# Patient Record
Sex: Male | Born: 1940 | Hispanic: Yes | Marital: Married | Smoking: Never smoker
Health system: Southern US, Community
[De-identification: ages and names within clinical notes are randomized; demographics above are authoritative.]

## PROBLEM LIST (undated history)

## (undated) DIAGNOSIS — E119 Type 2 diabetes mellitus without complications: Secondary | ICD-10-CM

---

## 2008-01-06 ENCOUNTER — Inpatient Hospital Stay (HOSPITAL_COMMUNITY): Admission: EM | Admit: 2008-01-06 | Discharge: 2008-01-17 | Payer: Self-pay | Admitting: Emergency Medicine

## 2008-01-24 ENCOUNTER — Ambulatory Visit: Payer: Self-pay | Admitting: Family Medicine

## 2009-06-10 ENCOUNTER — Inpatient Hospital Stay (HOSPITAL_COMMUNITY)
Admission: EM | Admit: 2009-06-10 | Discharge: 2009-06-14 | Payer: Self-pay | Source: Home / Self Care | Admitting: Emergency Medicine

## 2009-06-11 ENCOUNTER — Ambulatory Visit: Payer: Self-pay | Admitting: Internal Medicine

## 2009-06-27 ENCOUNTER — Encounter (INDEPENDENT_AMBULATORY_CARE_PROVIDER_SITE_OTHER): Payer: Self-pay | Admitting: Adult Health

## 2009-06-27 ENCOUNTER — Ambulatory Visit (HOSPITAL_COMMUNITY): Admission: RE | Admit: 2009-06-27 | Discharge: 2009-06-27 | Payer: Self-pay | Admitting: Internal Medicine

## 2009-06-27 ENCOUNTER — Ambulatory Visit: Payer: Self-pay | Admitting: Family Medicine

## 2009-06-27 LAB — CONVERTED CEMR LAB
ALT: 58 units/L — ABNORMAL HIGH (ref 0–53)
AST: 33 units/L (ref 0–37)
Albumin: 4.4 g/dL (ref 3.5–5.2)
Basophils Relative: 1 % (ref 0–1)
Eosinophils Absolute: 0.1 10*3/uL (ref 0.0–0.7)
Eosinophils Relative: 2 % (ref 0–5)
Free T4: 0.86 ng/dL (ref 0.80–1.80)
HCT: 42.4 % (ref 39.0–52.0)
Indirect Bilirubin: 0.5 mg/dL (ref 0.0–0.9)
Lymphs Abs: 2.7 10*3/uL (ref 0.7–4.0)
MCHC: 34.4 g/dL (ref 30.0–36.0)
Microalb, Ur: 2.14 mg/dL — ABNORMAL HIGH (ref 0.00–1.89)
Monocytes Relative: 7 % (ref 3–12)
Neutro Abs: 4.9 10*3/uL (ref 1.7–7.7)
Platelets: 303 10*3/uL (ref 150–400)
RDW: 12.9 % (ref 11.5–15.5)
TSH: 3.594 microintl units/mL (ref 0.350–4.500)
Total Protein: 8 g/dL (ref 6.0–8.3)
Vit D, 25-Hydroxy: 17 ng/mL — ABNORMAL LOW (ref 30–89)

## 2009-06-28 ENCOUNTER — Emergency Department (HOSPITAL_COMMUNITY): Admission: EM | Admit: 2009-06-28 | Discharge: 2009-06-28 | Payer: Self-pay | Admitting: Emergency Medicine

## 2009-06-29 ENCOUNTER — Ambulatory Visit: Payer: Self-pay | Admitting: Vascular Surgery

## 2009-06-29 ENCOUNTER — Ambulatory Visit (HOSPITAL_COMMUNITY): Admission: RE | Admit: 2009-06-29 | Discharge: 2009-06-29 | Payer: Self-pay | Admitting: Emergency Medicine

## 2009-07-03 ENCOUNTER — Ambulatory Visit: Payer: Self-pay | Admitting: Internal Medicine

## 2009-07-25 ENCOUNTER — Ambulatory Visit: Payer: Self-pay | Admitting: Internal Medicine

## 2009-07-29 ENCOUNTER — Ambulatory Visit: Payer: Self-pay | Admitting: Internal Medicine

## 2009-11-14 IMAGING — CT CT ABDOMEN W/ CM
3 of 7 series · 13 of 46 positions shown, 19 images · IV contrast (agent unspecified)
Comparison: None

CT ABDOMEN

CLINICAL DATA: Fever, vomiting, abdominal pain

CT ABDOMEN AND PELVIS WITH CONTRAST
TECHNIQUE: Multidetector CT imaging of the abdomen and pelvis was
performed using the standard protocol following bolus
administration of intravenous contrast.
Contrast: 100 ml Zmnipaque-HPP

[Series 2: routine abdomen · axial · 0.88mm/px · z∈[-448,+77]mm · 9 of 133 slices shown, 15 images]
[im 14/133  soft-tissue]
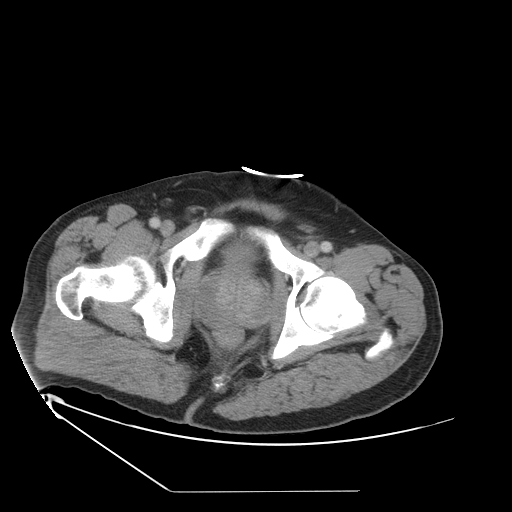
[im 14/133  bone]
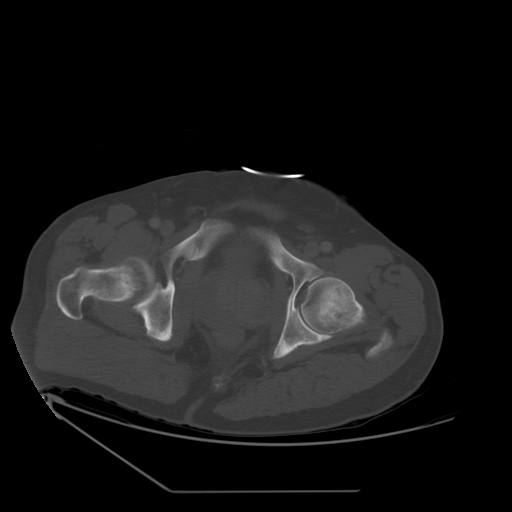
[im 27/133  soft-tissue]
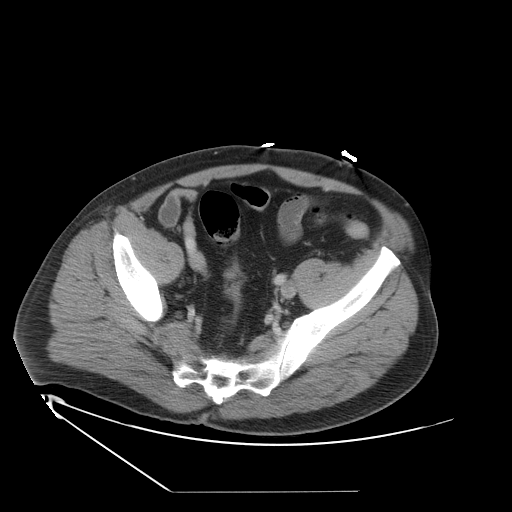
[im 40/133  soft-tissue]
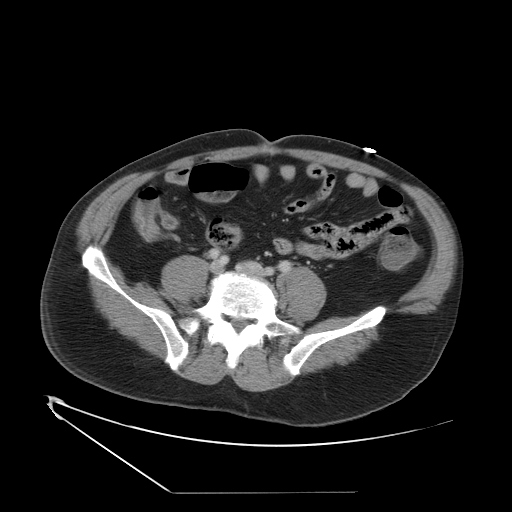
[im 53/133  soft-tissue]
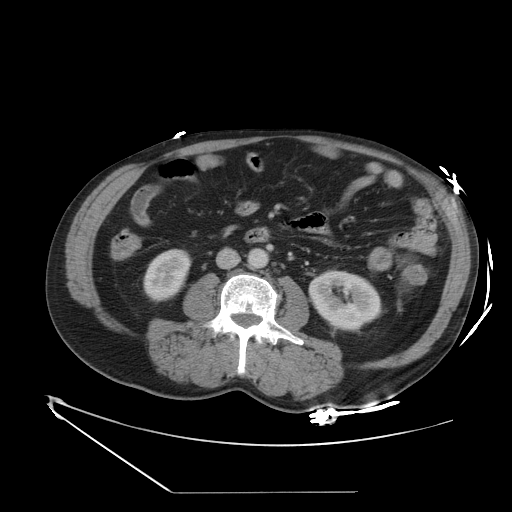
[im 67/133  soft-tissue]
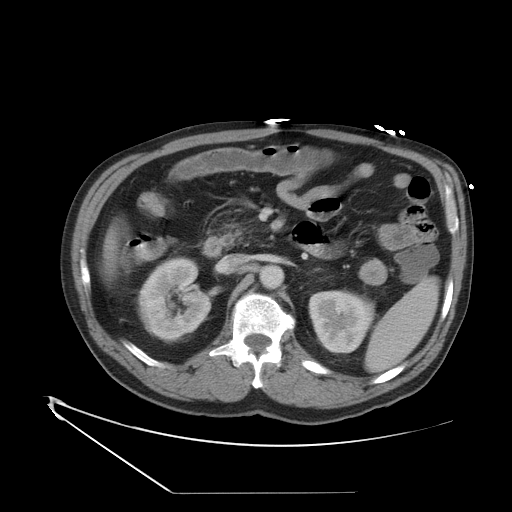
[im 80/133  soft-tissue]
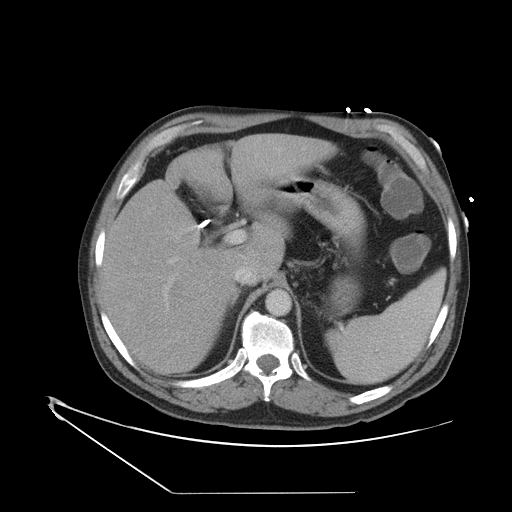
[im 80/133  lung]
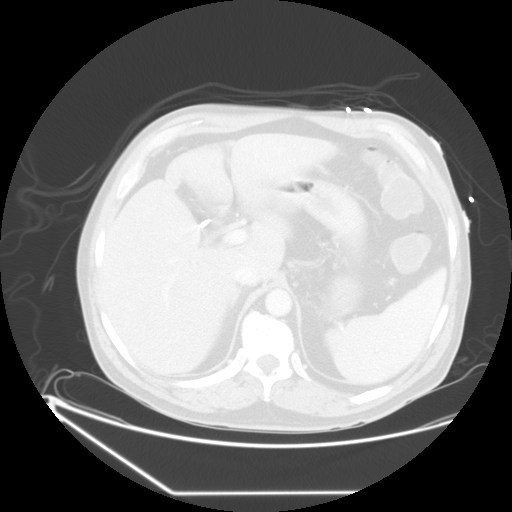
[im 93/133  soft-tissue]
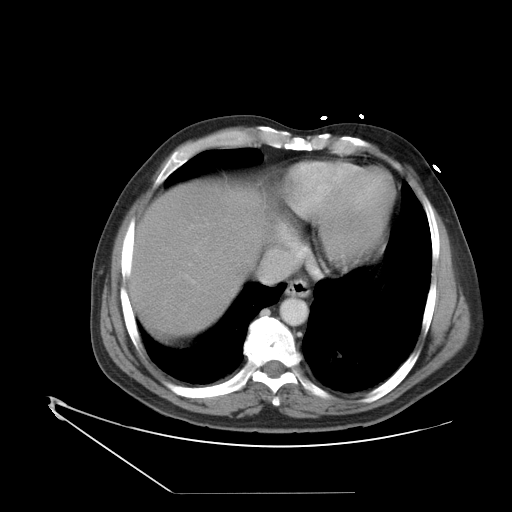
[im 93/133  lung]
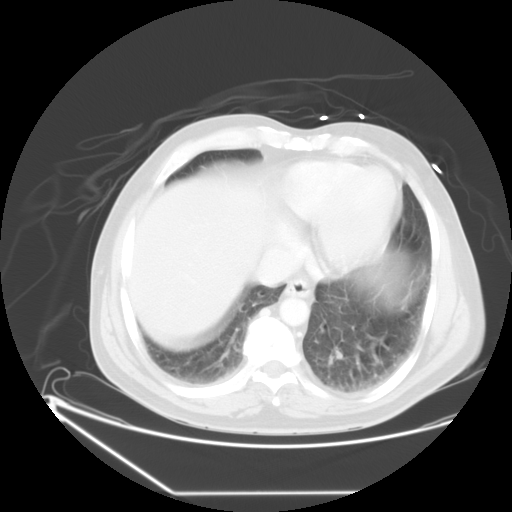
[im 106/133  soft-tissue]
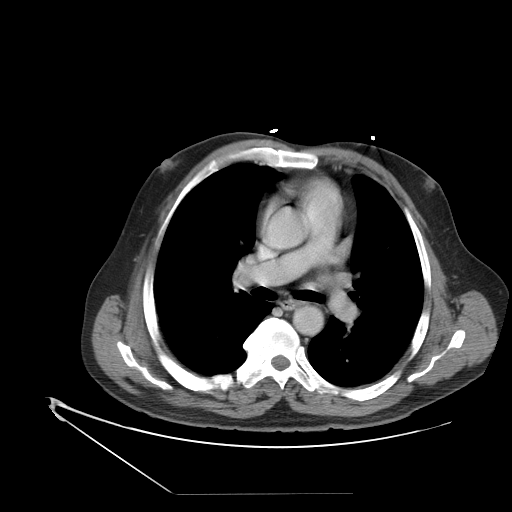
[im 106/133  lung]
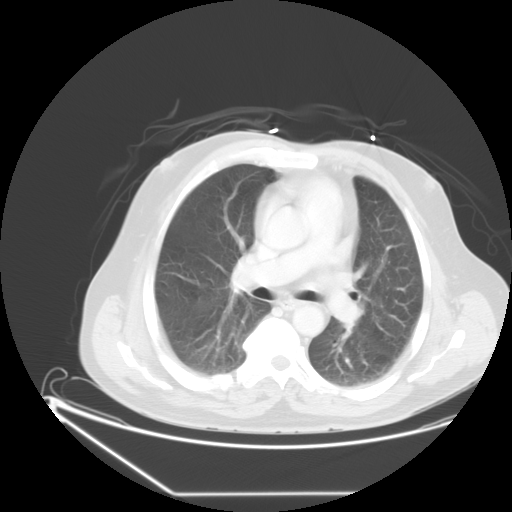
[im 119/133  soft-tissue]
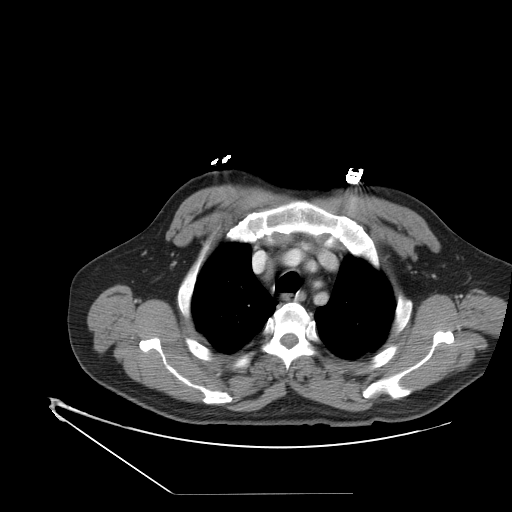
[im 119/133  lung]
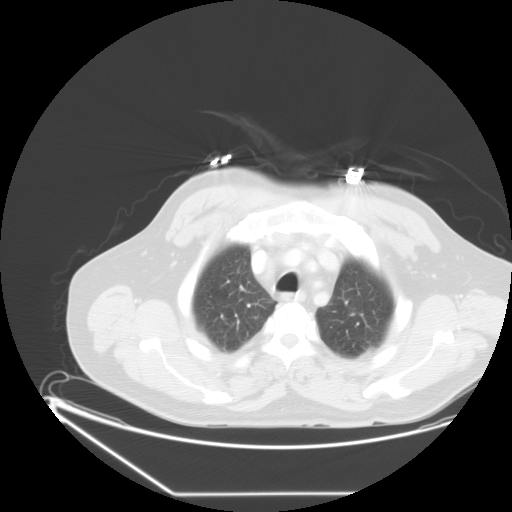
[im 119/133  bone]
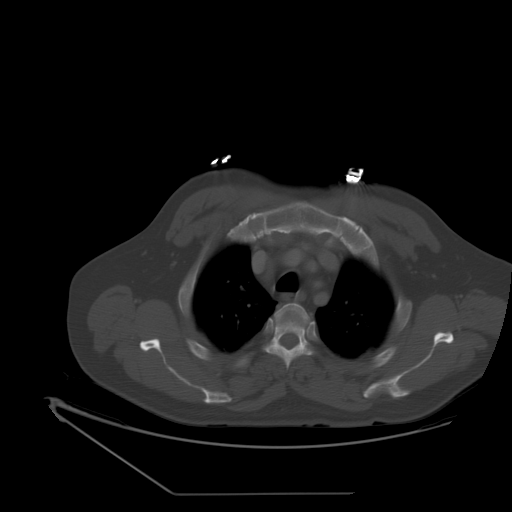

[Series 400: reformatted · sagittal · 0.90mm/px · 1 of 115 slices shown (1 of 2)]
[im 39/115  soft-tissue]
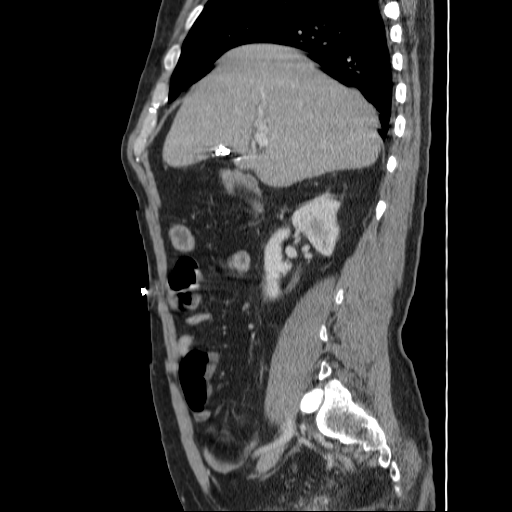

[Series 401: reformatted · coronal · 0.90mm/px · 3 of 110 slices shown (2 of 2)]
[im 37/110  soft-tissue]
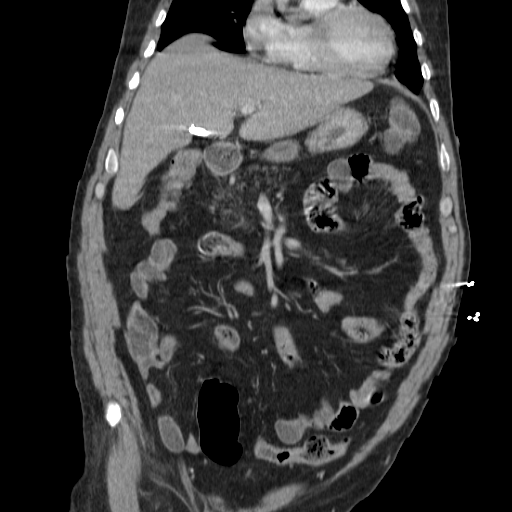
[im 49/110  soft-tissue]
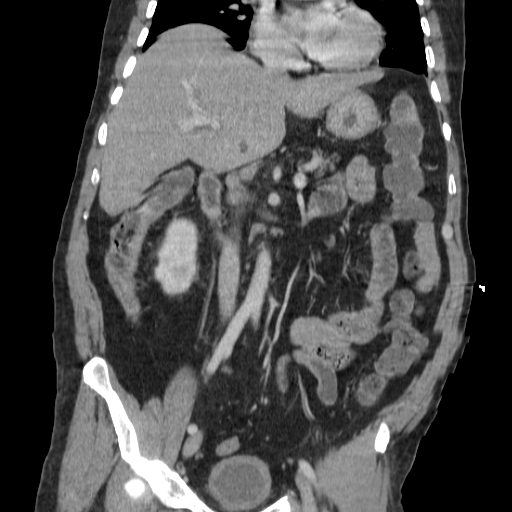
[im 61/110  soft-tissue]
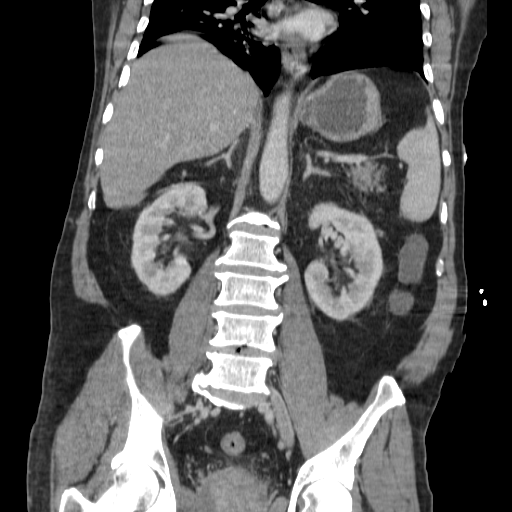

[13 of 46 positions shown; findings below may reference images not displayed]

FINDINGS: The lung bases are clear.  The liver enhances with no
focal abnormality and no ductal dilatation is seen.  Surgical clips
are present from prior cholecystectomy.  The pancreas appears
relatively fatty infiltrated particular the head of the pancreas.
No pancreatic ductal dilatation is seen.  The adrenal glands and
spleen appear normal.  The kidneys enhance and on delayed images
the pelvocaliceal systems appear normal.  The abdominal aorta is
normal in caliber.
IMPRESSION: No acute abnormality on CT of the abdomen.  Fatty infiltration of
the pancreas.

CT PELVIS
FINDINGS: The prostate is moderately enlarged.  The urinary
bladder is somewhat thick-walled although not distended, suggestive
of a degree of bladder outlet obstruction.  The appendix is
moderately well seen in the right lower quadrant and appears
normal.  No fluid is seen within the pelvis.  No bowel distention
is noted.
IMPRESSION: 1.  No acute abnormality.
2.  Moderately enlarged prostate with somewhat thick-walled urinary
bladder suggesting a degree of bladder outlet obstruction.
3.  The appendix appears normal.

## 2009-11-18 IMAGING — CR DG ABDOMEN 1V
1 series · 1 of 1 positions shown · non-contrast
Comparison: 12/29/2007

CLINICAL DATA: Evaluate for ileus.  Fever and vomiting.

ABDOMEN - 1 VIEW

[t abdomen supine]
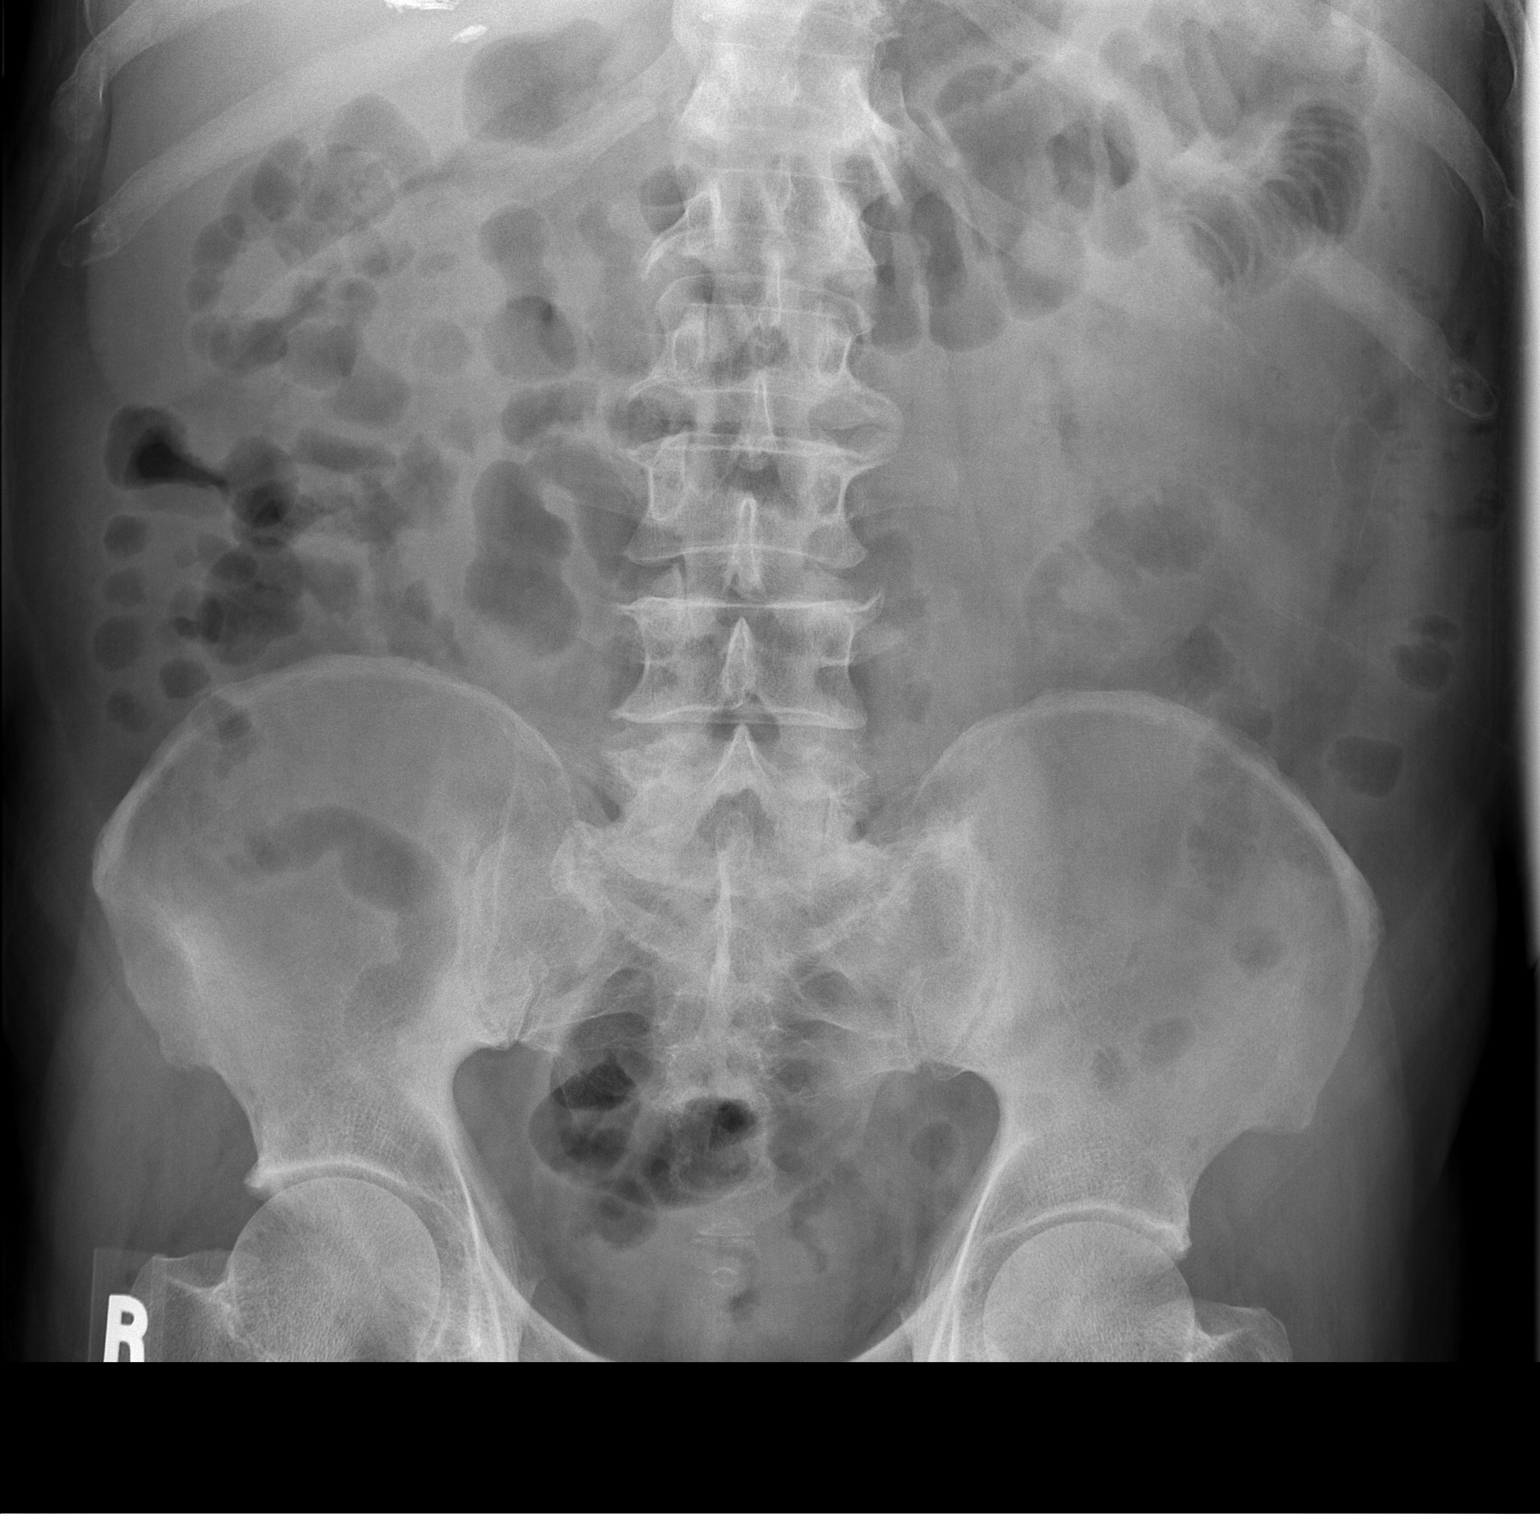

[1 of 1 positions shown; findings below may reference images not displayed]

FINDINGS: Single abdominal view was obtained.  There is gas in both
small and large bowel.  There is a mildly dilated loop of small
bowel in the left upper quadrant measuring up to 3.4 cm.  Evidence
for cholecystectomy clips in the right upper quadrant.
Degenerative endplate changes in the lumbar spine.
IMPRESSION: The abdominal bowel gas pattern is nonspecific although findings
could be related to an ileus pattern.

## 2010-04-27 LAB — POCT I-STAT, CHEM 8
BUN: 22 mg/dL (ref 6–23)
Chloride: 102 mEq/L (ref 96–112)
Glucose, Bld: 241 mg/dL — ABNORMAL HIGH (ref 70–99)
Potassium: 3.6 mEq/L (ref 3.5–5.1)

## 2010-04-28 LAB — COMPREHENSIVE METABOLIC PANEL
ALT: 363 U/L — ABNORMAL HIGH (ref 0–53)
AST: 142 U/L — ABNORMAL HIGH (ref 0–37)
AST: 251 U/L — ABNORMAL HIGH (ref 0–37)
Albumin: 3.3 g/dL — ABNORMAL LOW (ref 3.5–5.2)
Alkaline Phosphatase: 213 U/L — ABNORMAL HIGH (ref 39–117)
Alkaline Phosphatase: 231 U/L — ABNORMAL HIGH (ref 39–117)
CO2: 26 mEq/L (ref 19–32)
CO2: 28 mEq/L (ref 19–32)
Chloride: 100 mEq/L (ref 96–112)
Creatinine, Ser: 0.84 mg/dL (ref 0.4–1.5)
GFR calc Af Amer: >60 mL/min (ref >60)
GFR calc Af Amer: >60 mL/min (ref >60)
GFR calc non Af Amer: >60 mL/min (ref >60)
Glucose, Bld: 175 mg/dL — ABNORMAL HIGH (ref 70–99)
Potassium: 3.2 mEq/L — ABNORMAL LOW (ref 3.5–5.1)
Potassium: 3.8 mEq/L (ref 3.5–5.1)
Sodium: 137 mEq/L (ref 135–145)
Total Bilirubin: 3.8 mg/dL — ABNORMAL HIGH (ref 0.3–1.2)
Total Bilirubin: 6 mg/dL — ABNORMAL HIGH (ref 0.3–1.2)

## 2010-04-28 LAB — BASIC METABOLIC PANEL
BUN: 8 mg/dL (ref 6–23)
Creatinine, Ser: 0.62 mg/dL (ref 0.4–1.5)
GFR calc Af Amer: >60 mL/min (ref >60)
Potassium: 3.5 mEq/L (ref 3.5–5.1)
Sodium: 137 mEq/L (ref 135–145)

## 2010-04-28 LAB — CULTURE, BLOOD (ROUTINE X 2)
Culture: NO GROWTH
Culture: NO GROWTH

## 2010-04-28 LAB — GLUCOSE, CAPILLARY
Glucose-Capillary: 117 mg/dL — ABNORMAL HIGH (ref 70–99)
Glucose-Capillary: 131 mg/dL — ABNORMAL HIGH (ref 70–99)
Glucose-Capillary: 139 mg/dL — ABNORMAL HIGH (ref 70–99)
Glucose-Capillary: 146 mg/dL — ABNORMAL HIGH (ref 70–99)
Glucose-Capillary: 150 mg/dL — ABNORMAL HIGH (ref 70–99)
Glucose-Capillary: 175 mg/dL — ABNORMAL HIGH (ref 70–99)
Glucose-Capillary: 176 mg/dL — ABNORMAL HIGH (ref 70–99)
Glucose-Capillary: 193 mg/dL — ABNORMAL HIGH (ref 70–99)
Glucose-Capillary: 208 mg/dL — ABNORMAL HIGH (ref 70–99)

## 2010-04-28 LAB — COMPREHENSIVE METABOLIC PANEL WITH GFR
ALT: 206 U/L — ABNORMAL HIGH (ref 0–53)
AST: 66 U/L — ABNORMAL HIGH (ref 0–37)
Albumin: 3.5 g/dL (ref 3.5–5.2)
Alkaline Phosphatase: 217 U/L — ABNORMAL HIGH (ref 39–117)
BUN: 11 mg/dL (ref 6–23)
CO2: 31 meq/L (ref 19–32)
Calcium: 9 mg/dL (ref 8.4–10.5)
Chloride: 101 meq/L (ref 96–112)
Creatinine, Ser: 0.92 mg/dL (ref 0.4–1.5)
GFR calc Af Amer: >60 mL/min (ref >60)
GFR calc non Af Amer: >60 mL/min (ref >60)
Glucose, Bld: 133 mg/dL — ABNORMAL HIGH (ref 70–99)
Potassium: 4.1 meq/L (ref 3.5–5.1)
Sodium: 138 meq/L (ref 135–145)
Total Bilirubin: 2.4 mg/dL — ABNORMAL HIGH (ref 0.3–1.2)
Total Protein: 7.2 g/dL (ref 6.0–8.3)

## 2010-04-28 LAB — RAPID URINE DRUG SCREEN, HOSP PERFORMED
Amphetamines: NOT DETECTED
Barbiturates: NOT DETECTED
Benzodiazepines: NOT DETECTED
Cocaine: NOT DETECTED
Opiates: POSITIVE — AB
Tetrahydrocannabinol: NOT DETECTED

## 2010-04-28 LAB — HEPATIC FUNCTION PANEL
ALT: 383 U/L — ABNORMAL HIGH (ref 0–53)
Alkaline Phosphatase: 194 U/L — ABNORMAL HIGH (ref 39–117)
Alkaline Phosphatase: 227 U/L — ABNORMAL HIGH (ref 39–117)
Bilirubin, Direct: 3 mg/dL — ABNORMAL HIGH (ref 0.0–0.3)
Indirect Bilirubin: 1.1 mg/dL — ABNORMAL HIGH (ref 0.3–0.9)
Indirect Bilirubin: 2.1 mg/dL — ABNORMAL HIGH (ref 0.3–0.9)
Total Bilirubin: 1.5 mg/dL — ABNORMAL HIGH (ref 0.3–1.2)
Total Bilirubin: 5.1 mg/dL — ABNORMAL HIGH (ref 0.3–1.2)
Total Protein: 6.8 g/dL (ref 6.0–8.3)
Total Protein: 7.4 g/dL (ref 6.0–8.3)

## 2010-04-28 LAB — APTT: aPTT: 26 seconds (ref 24–37)

## 2010-04-28 LAB — T4, FREE: Free T4: 0.92 ng/dL (ref 0.80–1.80)

## 2010-04-28 LAB — ACETAMINOPHEN LEVEL: Acetaminophen (Tylenol), Serum: <10 ug/mL — ABNORMAL LOW (ref 10–30)

## 2010-04-28 LAB — MAGNESIUM: Magnesium: 2.2 mg/dL (ref 1.5–2.5)

## 2010-04-28 LAB — CARDIAC PANEL(CRET KIN+CKTOT+MB+TROPI)
CK, MB: 1.8 ng/mL (ref 0.3–4.0)
CK, MB: 1.8 ng/mL (ref 0.3–4.0)
CK, MB: 2.2 ng/mL (ref 0.3–4.0)
Relative Index: INVALID (ref 0.0–2.5)
Relative Index: INVALID (ref 0.0–2.5)
Relative Index: INVALID (ref 0.0–2.5)
Total CK: 78 U/L (ref 7–232)
Total CK: 85 U/L (ref 7–232)
Total CK: 92 U/L (ref 7–232)
Troponin I: 0.02 ng/mL (ref 0.00–0.06)
Troponin I: 0.03 ng/mL (ref 0.00–0.06)
Troponin I: 0.03 ng/mL (ref 0.00–0.06)

## 2010-04-28 LAB — DIFFERENTIAL
Basophils Absolute: 0.1 10*3/uL (ref 0.0–0.1)
Basophils Relative: 1 % (ref 0–1)
Eosinophils Relative: 1 % (ref 0–5)
Lymphocytes Relative: 18 % (ref 12–46)
Lymphs Abs: 1.4 10*3/uL (ref 0.7–4.0)
Monocytes Absolute: 0.5 10*3/uL (ref 0.1–1.0)
Monocytes Relative: 6 % (ref 3–12)
Neutro Abs: 5.7 10*3/uL (ref 1.7–7.7)
Neutrophils Relative %: 74 % (ref 43–77)

## 2010-04-28 LAB — URINE MICROSCOPIC-ADD ON

## 2010-04-28 LAB — TSH: TSH: 5.337 u[IU]/mL — ABNORMAL HIGH (ref 0.350–4.500)

## 2010-04-28 LAB — CBC
Hemoglobin: 15 g/dL (ref 13.0–17.0)
MCHC: 34.9 g/dL (ref 30.0–36.0)

## 2010-04-28 LAB — PROTIME-INR
INR: 1.01 (ref 0.00–1.49)
Prothrombin Time: 13.2 s (ref 11.6–15.2)

## 2010-04-28 LAB — URINALYSIS, ROUTINE W REFLEX MICROSCOPIC
Ketones, ur: NEGATIVE mg/dL
Specific Gravity, Urine: 1.027 (ref 1.005–1.030)

## 2010-04-28 LAB — LIPID PANEL
LDL Cholesterol: 113 mg/dL — ABNORMAL HIGH (ref 0–99)
Total CHOL/HDL Ratio: 4.2 RATIO
VLDL: 33 mg/dL (ref 0–40)

## 2010-04-28 LAB — LIPASE, BLOOD: Lipase: 12 U/L (ref 11–59)

## 2010-04-28 LAB — T3, FREE: T3, Free: 2 pg/mL — ABNORMAL LOW (ref 2.3–4.2)

## 2010-04-28 LAB — HEMOGLOBIN A1C
Hgb A1c MFr Bld: 7.6 % — ABNORMAL HIGH (ref <5.7)
Mean Plasma Glucose: 171 mg/dL — ABNORMAL HIGH (ref <117)

## 2010-04-28 LAB — HEPATITIS PANEL, ACUTE
Hep B C IgM: NEGATIVE
Hepatitis B Surface Ag: NEGATIVE

## 2010-06-23 NOTE — H&P (Signed)
Dwayne Davis, Dwayne Davis NO.:  [id number]   MEDICAL RECORD NO.:  [id number]          PATIENT TYPE:  EMS   LOCATION:  [facility]                         FACILITY:  [facility]   PHYSICIAN:  Vania Rea, M.D. DATE OF BIRTH:  Aug 07, 1940   DATE OF ADMISSION:  01/06/2008  DATE OF DISCHARGE:                              HISTORY & PHYSICAL   PRIMARY CARE PHYSICIAN:  Unassigned.   CHIEF COMPLAINT:  Fever, cough and pains all over.   HISTORY OF PRESENT ILLNESS:  This is a 70 year old Hispanic gentleman  who previously considered himself to be in good health until 3 days ago  when he developed sudden onset of fever, shaking chills, pains all over  and cough productive of a yellow sputum sometimes with blood.  Also  accompanied by headaches.  Today for the first time, he bought some  Tylenol and some Mucinex, but was feeling so sick that instead he came  to the emergency room where he was evaluated and found to have a high  fever and to be dehydrated.  The Hospitalist service was called to  assist with management.  The patient also describes an episode of  incontinence of urine starting 3 days ago.  Also although the history  has been taken by an interpreter, he appears to be saying that he has  decreased urine output associated with the urinary incontinence.  The  patient lives with wife and family, and denies any sick contacts.   PAST MEDICAL HISTORY:  History of laser kidney surgery about 4 years ago  in [place] for kidney stones.   MEDICATIONS:  One tablet of Mucinex, otherwise has taken no other  medications.   ALLERGIES:  NO KNOWN DRUG ALLERGIES.   SOCIAL HISTORY:  Denies tobacco, alcohol or illicit drug use.  Works as  a Engineer, water.   FAMILY HISTORY:  Significant only for mother with diabetes.  No other  significant family history.   REVIEW OF SYSTEMS:  On a 10-point review of systems, the patient denies  all problems other than noted above.  He complains of  irritation in the left eye and feeling as if there is  trash in his left eye.  Also photophobia in the left eye.   PHYSICAL EXAMINATION:  GENERAL:  Very ill-looking, but well-built  Hispanic gentleman lying in bed in no acute distress.  VITAL SIGNS:  Temperature 102.8, pulse 124, respirations 23, blood  pressure 110/58.  He is saturating 97% on 2 liters.  HEENT:  His pupils are round and equal.  Mucous membranes are pink,  anicteric.  NECK:  No cervical lymphadenopathy or thyromegaly.  No jugular venous  distention.  CHEST:  He has coarse crackles at the left base.  CARDIOVASCULAR:  He is tachycardic.  There is no murmur.  ABDOMEN:  Slightly obese, soft, nontender.  There are no masses.  EXTREMITIES:  Without edema.  He has a scaly rash between all toes of  his feet.  He has no bone or joint deformities.  SKIN:  Without blemish, but he is sweating profusely.   LABORATORY DATA:  White count 10.7, hemoglobin 15,  MCV 91, platelets are  decreased to 71, 91% neutrophils, absolute monocyte count 9.7, sodium  129, potassium 3.4, chloride 97, CO2 22, glucose 365, BUN 13, creatinine  1.22, calcium 8.9, total protein 7.12, albumin 3.4, total bilirubin 1.3,  lipase is undetectable.  ABG shows pH 7.46, PCO2 27, PO2 64, saturating  at 93% on 2 liters.  Lactic acid was slightly elevated at 3.1.  His beta-  natriuretic peptide is 89.  Urinalysis shows cloudy urine with a  specific gravity greater than 1.046, ketones 15, total protein 100,  nitrite positive, small amount of leukocyte esterase, glucose 100, white  cells 11-20, urine RBC 11-20 and many bacteria.   DIAGNOSTICS:  1. Two-view chest x-ray shows bronchitis without focal pneumonia.  2. CT scan of the abdomen and pelvis shows fatty infiltrate of the      pancreas, moderately enlarged prostate with somewhat thick wall,      though not distended bladder suggestive of a degree of bladder      outlet obstruction.  No fluid is seen in the  pelvis.  No bowel      distention.  The appendix is normal.   ASSESSMENT:  1. Acute viral illness, probable H1N1.  2. Clinically pneumonia, not apparently evident on chest x-ray      possibly due to dehydration.  3. Dehydration as evidenced by elevated BUN and creatinine ratio,      hyponatremia and concentrated urine.  4. Newly diagnosed diabetes.  5. Urinary tract infection.  6. Benign prostatic hypertrophy likely contributing to urinary tract      infection.  7. Thrombocytopenia probably due to viral illness.   PLAN:  We will admit this gentleman to telemetry unit with IV fluid  hydration, intravenous antibiotics, blood and urine cultures have  already been drawn prior to giving antibiotics.  We will start him on  sliding-scale coverage for his diabetes as well as long-acting insulin.  Other plans as per orders.      Vania Rea, M.D.  Electronically Signed     LC/MEDQ  D:  01/06/2008  T:  01/06/2008  Job:  [id number]   cc:   Jennet Maduro, MD

## 2010-06-23 NOTE — Discharge Summary (Signed)
NAMEEFFREY, DAVIDOW             ACCOUNT NO.:  [id number]   MEDICAL RECORD NO.:  [id number]          PATIENT TYPE:  INP   LOCATION:  5508                         FACILITY:  [facility]   PHYSICIAN:  Herbie Saxon, MDDATE OF BIRTH:  03-Jun-1940   DATE OF ADMISSION:  01/05/2008  DATE OF DISCHARGE:  01/16/2008                               DISCHARGE SUMMARY   DISCHARGE DIAGNOSES:  1. Escherichia coli urinary tract infection.  2. Pyelonephritis.  3. Sepsis.  4. Metabolic acidosis, resolved.  5. Diabetes, improved control.  6. Thrombocytopenia.  7. Anemia of chronic disease.  8. Ileus, resolved.  9. Upper respiratory tract infection with flu-like illness.  10.Benign prostatic hypertrophy.  11.Obstructive uropathy.  12.Elevated prostate-specific antigen, rule out underlying prostate      neoplasm.  13.Hypokalemia, resolved.   RADIOLOGY:  The chest x-ray on January 05, 2008, shows questionable  bronchitis with peribronchial thickening.  CT abdomen and pelvis on  January 06, 2008, shows fatty infiltration of the pancreas, moderate  enlarged prostate, thick-walled gallbladder suggestive of the degree of  bladder outlet obstruction.  The chest x-ray on January 06, 2008, shows  poor inspiration with cardiomegaly and central pulmonary vascular  prominence.  Abdominal x-ray on January 06, 2008, shows bowel-gas  pattern compatible with mild ileus.  Repeat chest x-ray on January 08, 2008, shows no acute findings.  Repeat abdominal x-ray on January 10, 2008, shows nonspecified abdominal gas pattern although findings could  be related to an ileus pattern.   HOSPITAL COURSE:  This 70 year old Hispanic male presented to the  emergency room with fever, cough, and generalized body pains.  On  presentation, he had high-grade fever and he was clinically dehydrated  with an elevated BUN and creatinine ratio.  The patient also had  hyponatremia and concentrated urine.  He had not  previously been  diagnosed of diabetes.  Because of the suspicion of possible underlying  H1N1 flu illness, the patient was started on droplet precaution.  The  patient was also having some diarrhea, for the suspicion of C. diff  colitis, he was placed on contact precautions as well.  He was started  on IV Zithromax, Zosyn, and Tamiflu.  He was also started on sliding  scale insulin coverage with Lantus basal coverage.  The patient was  having poor stream of urine and he needed intermittent Foley  catheterization.  He was unable to have a gastric emptying study done on  January 09, 2008.  This is to be rescheduled for the patient for  underlying gastroparesis.  Cough is much improved with scanty yellow  phlegm.  C. diff toxin is negative.  Flagyl p.o. was added on January 11, 2008.  His LFTs that were elevated have been gradually trending down.  The Tamiflu was discontinued after a 1-week course on January 12, 2008.  Cipro IV was also added on January 12, 2008, with Levaquin and IV  __________ been started instead.  His leukocytosis is resolved.  The  patient has been ambulating with some assistance by January 14, 2008.  He still has mild hyponatremia, but glycemia control  is optimal.   DISCHARGE CONDITION:  Stable.   DISPOSITION:  Home, with home health PT and RN.   DIET:  An 1800-calorie ADA.   ACTIVITY:  To be increased slowly as tolerated.   FOLLOWUP:  He is to follow up with [facility] on January 24, 2008,  and [facility] is going to arrange Urology and Oncology followup as  needed in the next 1-2 weeks because of the elevated PSA of 22.   MEDICATIONS ON DISCHARGE:  1. Flomax 0.4 mg daily.  2. Lotrimin cream topically twice daily to skin folds.  3. Flagyl 500 mg twice daily for 3 more days.  4. Diflucan 100 mg daily 3 more days.  5. Cafatine 500 mg b.i.d. for 3 more days.  6. Multivitamin 1 tablet daily.  7. Tussionex 5 mL twice daily for 3 more days.  8. Mucinex 600  mg twice daily for 3 more days.  9. NovoLog sliding scale insulin coverage, the moderate scale.  10.Senokot 1 tablet at bedtime as needed for constipation.  11.Lantus insulin 18 units subcu at bedtime.  12.Tylenol 650 mg q.6 h. p.r.n. for fever and headache.   PHYSICAL EXAMINATION:  VITAL SIGNS:  On examination today, temperature  is 98, pulse is 80, respiratory rate is 20, and blood pressure 95/51.  HEENT:  Pupils are equal and reactive to light and accommodation.  Head  is atraumatic and normocephalic.  NECK:  Supple.  Oropharynx and nasopharynx are clear.  He is mildly  clinically pale, not jaundiced.  There is no cyanosis or finger  clubbing.  CHEST:  Clinically clear.  No rhonchi.  HEART:  Heart sounds 1 and 2 are regular.  No murmurs, rubs, or gallops.  ABDOMEN:  Soft and nontender.  No organomegaly.  Bowel sounds are  present.  CNS:  No neurologic deficits.  EXTREMITIES:  Peripheral pulses present.  No calf tenderness.  SKIN:  No erythema or new rash.  MUSCULOSKELETAL:  No joint swelling.   LABORATORY DATA:  WBC is 8, hematocrit is 38, and platelet count 297.  Chemistry:  Sodium is 132, potassium 4.4, chloride 102, bicarbonate 27,  glucose 113, BUN 11, and creatinine 0.9.  Stool culture on January 07, 2008, shows no Salmonella, Shigella, Campylobacter, or Yersinia.  There  were a few yeast present.   The patient is being on fluid restriction of less than 1 L for the next  24 hours.  His sodium level with LFTs will be repeated prior to  discharge.      Herbie Saxon, MD  Electronically Signed     MIO/MEDQ  D:  01/15/2008  T:  01/16/2008  Job:  [id number]   cc:   [facility]

## 2010-06-23 NOTE — Discharge Summary (Signed)
Dwayne Davis, ALBAUGH             ACCOUNT NO.:  [id number]   MEDICAL RECORD NO.:  [id number]           PATIENT TYPE:   LOCATION:                                 FACILITY:   PHYSICIAN:  Beckey Rutter, MD  DATE OF BIRTH:  [birth year]   DATE OF ADMISSION:  DATE OF DISCHARGE:                               DISCHARGE SUMMARY   ADDENDUM   PRIMARY CARE PHYSICIAN:  Unassigned.   DISCHARGE DIAGNOSIS:  Please refer to the previously dictated discharge  summary on January 15, 2008, by Dr. Christella Noa.   DISCHARGE MEDICATIONS:  The list of medication was changed to the  following:  1. Flomax 0.4 mg daily.  2. Lotrimin cream topically twice a day for skin folds.  3. Flagyl 500 mg twice a day for 1 day.  4. Diflucan 100 mg daily for 1 day.  5. Cafatine 500 mg twice a day for 1 day tomorrow.  6. Multivitamins daily.  7. Tussionex 5 mL twice a day for 3 days.  8. NovoLog sliding scale insulin, moderate scale.  9. Senokot 1 tab p.o. at bedtime.  10.Lantus insulin 18 units subcutaneously at bedtime.  11.Tylenol on a p.r.n. basis.   Please note the Mucinex was in the previous discharge summary list is  discontinued at this time.   PLAN:  The patient will be following up with [facility] and appointment  was already scheduled for that.  The patient has urologic symptoms and  he has Foley catheter during the hospital stay, it was going to be  discontinued and we will wait for the patient to void.  The patient was  advised to follow up with Urology and the number was provided to him  which is [phone].  Prescription was given.  Discharge plan and  medical problem was discussed with him thoroughly via the interpreter,  Garcella, at this time of the discharge summary.  All question was  encouraged and answered.      Beckey Rutter, MD  Electronically Signed     EME/MEDQ  D:  01/17/2008  T:  01/17/2008  Job:  [id number]

## 2010-11-10 LAB — DIFFERENTIAL
Basophils Absolute: 0 10*3/uL (ref 0.0–0.1)
Eosinophils Absolute: 0 10*3/uL (ref 0.0–0.7)
Eosinophils Absolute: 0 10*3/uL (ref 0.0–0.7)
Eosinophils Relative: 0 % (ref 0–5)
Lymphocytes Relative: 5 % — ABNORMAL LOW (ref 12–46)
Lymphs Abs: 0.6 10*3/uL — ABNORMAL LOW (ref 0.7–4.0)
Lymphs Abs: 0.9 10*3/uL (ref 0.7–4.0)
Monocytes Absolute: 0.6 10*3/uL (ref 0.1–1.0)
Monocytes Relative: 4 % (ref 3–12)
Neutro Abs: 9.7 10*3/uL — ABNORMAL HIGH (ref 1.7–7.7)
Neutrophils Relative %: 90 % — ABNORMAL HIGH (ref 43–77)

## 2010-11-10 LAB — BLOOD GAS, ARTERIAL
Acid-base deficit: 1.7 mmol/L (ref 0.0–2.0)
O2 Content: 2 L/min
pCO2 arterial: 36.5 mmHg (ref 35.0–45.0)
pH, Arterial: 7.404 (ref 7.350–7.450)
pO2, Arterial: 91.8 mmHg (ref 80.0–100.0)

## 2010-11-10 LAB — COMPREHENSIVE METABOLIC PANEL
ALT: 46 U/L (ref 0–53)
ALT: 80 U/L — ABNORMAL HIGH (ref 0–53)
AST: 66 U/L — ABNORMAL HIGH (ref 0–37)
Albumin: 1.9 g/dL — ABNORMAL LOW (ref 3.5–5.2)
CO2: 24 mEq/L (ref 19–32)
Calcium: 8.9 mg/dL (ref 8.4–10.5)
Chloride: 113 mEq/L — ABNORMAL HIGH (ref 96–112)
GFR calc Af Amer: >60 mL/min (ref >60)
GFR calc Af Amer: >60 mL/min (ref >60)
GFR calc non Af Amer: >60 mL/min (ref >60)
Glucose, Bld: 365 mg/dL — ABNORMAL HIGH (ref 70–99)
Potassium: 3.5 mEq/L (ref 3.5–5.1)
Sodium: 129 mEq/L — ABNORMAL LOW (ref 135–145)
Sodium: 140 mEq/L (ref 135–145)
Total Bilirubin: 1.1 mg/dL (ref 0.3–1.2)
Total Protein: 7.2 g/dL (ref 6.0–8.3)

## 2010-11-10 LAB — URINE CULTURE: Colony Count: >=100000

## 2010-11-10 LAB — BASIC METABOLIC PANEL
BUN: 24 mg/dL — ABNORMAL HIGH (ref 6–23)
BUN: 24 mg/dL — ABNORMAL HIGH (ref 6–23)
CO2: 22 mEq/L (ref 19–32)
Calcium: 7.3 mg/dL — ABNORMAL LOW (ref 8.4–10.5)
Calcium: 7.7 mg/dL — ABNORMAL LOW (ref 8.4–10.5)
Chloride: 101 mEq/L (ref 96–112)
Creatinine, Ser: 0.98 mg/dL (ref 0.4–1.5)
GFR calc Af Amer: >60 mL/min (ref >60)
GFR calc non Af Amer: >60 mL/min (ref >60)
Glucose, Bld: 179 mg/dL — ABNORMAL HIGH (ref 70–99)
Glucose, Bld: 294 mg/dL — ABNORMAL HIGH (ref 70–99)

## 2010-11-10 LAB — POCT I-STAT 3, ART BLOOD GAS (G3+)
Acid-base deficit: 4 mmol/L — ABNORMAL HIGH (ref 0.0–2.0)
Bicarbonate: 20 mEq/L (ref 20.0–24.0)
pCO2 arterial: 27.2 mmHg — ABNORMAL LOW (ref 35.0–45.0)
pCO2 arterial: 32.6 mmHg — ABNORMAL LOW (ref 35.0–45.0)
pH, Arterial: 7.466 — ABNORMAL HIGH (ref 7.350–7.450)
pO2, Arterial: 64 mmHg — ABNORMAL LOW (ref 80.0–100.0)
pO2, Arterial: 64 mmHg — ABNORMAL LOW (ref 80.0–100.0)

## 2010-11-10 LAB — GLUCOSE, CAPILLARY
Glucose-Capillary: 147 mg/dL — ABNORMAL HIGH (ref 70–99)
Glucose-Capillary: 162 mg/dL — ABNORMAL HIGH (ref 70–99)
Glucose-Capillary: 163 mg/dL — ABNORMAL HIGH (ref 70–99)
Glucose-Capillary: 181 mg/dL — ABNORMAL HIGH (ref 70–99)
Glucose-Capillary: 256 mg/dL — ABNORMAL HIGH (ref 70–99)
Glucose-Capillary: 273 mg/dL — ABNORMAL HIGH (ref 70–99)

## 2010-11-10 LAB — RETICULOCYTES
RBC.: 4.16 MIL/uL — ABNORMAL LOW (ref 4.22–5.81)
Retic Ct Pct: 0.8 % (ref 0.4–3.1)

## 2010-11-10 LAB — URINE MICROSCOPIC-ADD ON

## 2010-11-10 LAB — STOOL CULTURE

## 2010-11-10 LAB — LACTIC ACID, PLASMA: Lactic Acid, Venous: 3.1 mmol/L — ABNORMAL HIGH (ref 0.5–2.2)

## 2010-11-10 LAB — IRON AND TIBC

## 2010-11-10 LAB — APTT: aPTT: 36 seconds (ref 24–37)

## 2010-11-10 LAB — CBC
Hemoglobin: 15 g/dL (ref 13.0–17.0)
MCHC: 34.2 g/dL (ref 30.0–36.0)
MCHC: 34.3 g/dL (ref 30.0–36.0)
MCV: 90.6 fL (ref 78.0–100.0)
Platelets: 40 10*3/uL — CL (ref 150–400)
Platelets: 49 10*3/uL — CL (ref 150–400)
Platelets: 63 10*3/uL — ABNORMAL LOW (ref 150–400)
RBC: 3.77 MIL/uL — ABNORMAL LOW (ref 4.22–5.81)
RBC: 3.9 MIL/uL — ABNORMAL LOW (ref 4.22–5.81)
RDW: 13 % (ref 11.5–15.5)
RDW: 13.4 % (ref 11.5–15.5)
RDW: 14 % (ref 11.5–15.5)
WBC: 10 10*3/uL (ref 4.0–10.5)

## 2010-11-10 LAB — CULTURE, BLOOD (ROUTINE X 2): Culture: NO GROWTH

## 2010-11-10 LAB — GIARDIA/CRYPTOSPORIDIUM SCREEN(EIA): Cryptosporidium Screen (EIA): NEGATIVE

## 2010-11-10 LAB — DIC (DISSEMINATED INTRAVASCULAR COAGULATION)PANEL
INR: 1.2 (ref 0.00–1.49)
Smear Review: NONE SEEN

## 2010-11-10 LAB — HEMOGLOBIN A1C: Hgb A1c MFr Bld: 11.5 % — ABNORMAL HIGH (ref 4.6–6.1)

## 2010-11-10 LAB — URINALYSIS, ROUTINE W REFLEX MICROSCOPIC
Bilirubin Urine: NEGATIVE
Protein, ur: 100 mg/dL — AB
Urobilinogen, UA: 1 mg/dL (ref 0.0–1.0)

## 2010-11-10 LAB — FREE PSA
PSA, Free Pct: 38 % (ref >25)
PSA, Free: 8.5 ng/mL

## 2010-11-10 LAB — CORTISOL: Cortisol, Plasma: 22.9 ug/dL

## 2010-11-10 LAB — VITAMIN B12: Vitamin B-12: 577 pg/mL (ref 211–911)

## 2010-11-10 LAB — B-NATRIURETIC PEPTIDE (CONVERTED LAB): Pro B Natriuretic peptide (BNP): 89 pg/mL (ref 0.0–100.0)

## 2010-11-10 LAB — LIPID PANEL
LDL Cholesterol: 16 mg/dL (ref 0–99)
VLDL: 39 mg/dL (ref 0–40)

## 2010-11-10 LAB — FECAL LACTOFERRIN, QUANT: Fecal Lactoferrin: POSITIVE

## 2010-11-13 LAB — COMPREHENSIVE METABOLIC PANEL
ALT: 54 U/L — ABNORMAL HIGH (ref 0–53)
ALT: 54 U/L — ABNORMAL HIGH (ref 0–53)
ALT: 55 U/L — ABNORMAL HIGH (ref 0–53)
AST: 47 U/L — ABNORMAL HIGH (ref 0–37)
AST: 54 U/L — ABNORMAL HIGH (ref 0–37)
AST: 59 U/L — ABNORMAL HIGH (ref 0–37)
Albumin: 2.5 g/dL — ABNORMAL LOW (ref 3.5–5.2)
Alkaline Phosphatase: 141 U/L — ABNORMAL HIGH (ref 39–117)
Alkaline Phosphatase: 143 U/L — ABNORMAL HIGH (ref 39–117)
BUN: 12 mg/dL (ref 6–23)
BUN: 13 mg/dL (ref 6–23)
CO2: 25 mEq/L (ref 19–32)
CO2: 27 mEq/L (ref 19–32)
Calcium: 8.1 mg/dL — ABNORMAL LOW (ref 8.4–10.5)
Calcium: 8.4 mg/dL (ref 8.4–10.5)
Chloride: 102 mEq/L (ref 96–112)
Chloride: 102 mEq/L (ref 96–112)
Chloride: 99 mEq/L (ref 96–112)
Creatinine, Ser: 0.77 mg/dL (ref 0.4–1.5)
GFR calc Af Amer: >60 mL/min (ref >60)
GFR calc Af Amer: >60 mL/min (ref >60)
GFR calc Af Amer: >60 mL/min (ref >60)
GFR calc Af Amer: >60 mL/min (ref >60)
GFR calc non Af Amer: >60 mL/min (ref >60)
GFR calc non Af Amer: >60 mL/min (ref >60)
GFR calc non Af Amer: >60 mL/min (ref >60)
Glucose, Bld: 146 mg/dL — ABNORMAL HIGH (ref 70–99)
Potassium: 4.4 mEq/L (ref 3.5–5.1)
Potassium: 4.5 mEq/L (ref 3.5–5.1)
Sodium: 132 mEq/L — ABNORMAL LOW (ref 135–145)
Sodium: 132 mEq/L — ABNORMAL LOW (ref 135–145)
Total Bilirubin: 1.6 mg/dL — ABNORMAL HIGH (ref 0.3–1.2)
Total Protein: 5.9 g/dL — ABNORMAL LOW (ref 6.0–8.3)
Total Protein: 6.2 g/dL (ref 6.0–8.3)

## 2010-11-13 LAB — BASIC METABOLIC PANEL
BUN: 11 mg/dL (ref 6–23)
CO2: 25 mEq/L (ref 19–32)
CO2: 27 mEq/L (ref 19–32)
Calcium: 7.6 mg/dL — ABNORMAL LOW (ref 8.4–10.5)
Chloride: 102 mEq/L (ref 96–112)
Chloride: 102 mEq/L (ref 96–112)
Chloride: 105 mEq/L (ref 96–112)
Creatinine, Ser: 0.68 mg/dL (ref 0.4–1.5)
Creatinine, Ser: 0.68 mg/dL (ref 0.4–1.5)
GFR calc Af Amer: >60 mL/min (ref >60)
GFR calc Af Amer: >60 mL/min (ref >60)
GFR calc non Af Amer: >60 mL/min (ref >60)
GFR calc non Af Amer: >60 mL/min (ref >60)
Glucose, Bld: 149 mg/dL — ABNORMAL HIGH (ref 70–99)
Glucose, Bld: 158 mg/dL — ABNORMAL HIGH (ref 70–99)
Potassium: 3.3 mEq/L — ABNORMAL LOW (ref 3.5–5.1)
Potassium: 4.5 mEq/L (ref 3.5–5.1)
Sodium: 133 mEq/L — ABNORMAL LOW (ref 135–145)
Sodium: 136 mEq/L (ref 135–145)
Sodium: 136 mEq/L (ref 135–145)

## 2010-11-13 LAB — GLUCOSE, CAPILLARY
Glucose-Capillary: 102 mg/dL — ABNORMAL HIGH (ref 70–99)
Glucose-Capillary: 103 mg/dL — ABNORMAL HIGH (ref 70–99)
Glucose-Capillary: 106 mg/dL — ABNORMAL HIGH (ref 70–99)
Glucose-Capillary: 114 mg/dL — ABNORMAL HIGH (ref 70–99)
Glucose-Capillary: 115 mg/dL — ABNORMAL HIGH (ref 70–99)
Glucose-Capillary: 122 mg/dL — ABNORMAL HIGH (ref 70–99)
Glucose-Capillary: 144 mg/dL — ABNORMAL HIGH (ref 70–99)
Glucose-Capillary: 148 mg/dL — ABNORMAL HIGH (ref 70–99)
Glucose-Capillary: 151 mg/dL — ABNORMAL HIGH (ref 70–99)
Glucose-Capillary: 153 mg/dL — ABNORMAL HIGH (ref 70–99)
Glucose-Capillary: 154 mg/dL — ABNORMAL HIGH (ref 70–99)
Glucose-Capillary: 157 mg/dL — ABNORMAL HIGH (ref 70–99)
Glucose-Capillary: 162 mg/dL — ABNORMAL HIGH (ref 70–99)
Glucose-Capillary: 166 mg/dL — ABNORMAL HIGH (ref 70–99)
Glucose-Capillary: 168 mg/dL — ABNORMAL HIGH (ref 70–99)
Glucose-Capillary: 169 mg/dL — ABNORMAL HIGH (ref 70–99)
Glucose-Capillary: 182 mg/dL — ABNORMAL HIGH (ref 70–99)
Glucose-Capillary: 190 mg/dL — ABNORMAL HIGH (ref 70–99)
Glucose-Capillary: 193 mg/dL — ABNORMAL HIGH (ref 70–99)
Glucose-Capillary: 218 mg/dL — ABNORMAL HIGH (ref 70–99)
Glucose-Capillary: 228 mg/dL — ABNORMAL HIGH (ref 70–99)
Glucose-Capillary: 233 mg/dL — ABNORMAL HIGH (ref 70–99)
Glucose-Capillary: 235 mg/dL — ABNORMAL HIGH (ref 70–99)

## 2010-11-13 LAB — CLOSTRIDIUM DIFFICILE EIA
C difficile Toxins A+B, EIA: NEGATIVE
C difficile Toxins A+B, EIA: NEGATIVE

## 2010-11-13 LAB — CBC
HCT: 34.3 % — ABNORMAL LOW (ref 39.0–52.0)
HCT: 39.7 % (ref 39.0–52.0)
Hemoglobin: 11.8 g/dL — ABNORMAL LOW (ref 13.0–17.0)
Hemoglobin: 12.1 g/dL — ABNORMAL LOW (ref 13.0–17.0)
Hemoglobin: 13.5 g/dL (ref 13.0–17.0)
MCHC: 34.1 g/dL (ref 30.0–36.0)
MCHC: 34.7 g/dL (ref 30.0–36.0)
MCHC: 35.1 g/dL (ref 30.0–36.0)
MCHC: 35.3 g/dL (ref 30.0–36.0)
MCHC: 35.4 g/dL (ref 30.0–36.0)
MCV: 87.6 fL (ref 78.0–100.0)
MCV: 88.6 fL (ref 78.0–100.0)
MCV: 90.4 fL (ref 78.0–100.0)
Platelets: 209 10*3/uL (ref 150–400)
Platelets: 266 10*3/uL (ref 150–400)
Platelets: 68 10*3/uL — ABNORMAL LOW (ref 150–400)
Platelets: 92 10*3/uL — ABNORMAL LOW (ref 150–400)
RBC: 4.18 MIL/uL — ABNORMAL LOW (ref 4.22–5.81)
RBC: 4.34 MIL/uL (ref 4.22–5.81)
RDW: 14.2 % (ref 11.5–15.5)
RDW: 14.3 % (ref 11.5–15.5)
RDW: 14.5 % (ref 11.5–15.5)
WBC: 12.4 10*3/uL — ABNORMAL HIGH (ref 4.0–10.5)
WBC: 15.6 10*3/uL — ABNORMAL HIGH (ref 4.0–10.5)
WBC: 8.8 10*3/uL (ref 4.0–10.5)

## 2010-11-13 LAB — SODIUM: Sodium: 133 mEq/L — ABNORMAL LOW (ref 135–145)

## 2010-11-13 LAB — AST: AST: 43 U/L — ABNORMAL HIGH (ref 0–37)

## 2010-11-13 LAB — HEPATITIS B SURFACE ANTIGEN: Hepatitis B Surface Ag: NEGATIVE

## 2015-06-10 ENCOUNTER — Emergency Department (HOSPITAL_COMMUNITY)
Admission: EM | Admit: 2015-06-10 | Discharge: 2015-06-11 | Disposition: A | Discharge status: Home / Self Care | Payer: Medicare Other | Attending: Emergency Medicine | Admitting: Emergency Medicine

## 2015-06-10 ENCOUNTER — Emergency Department (HOSPITAL_COMMUNITY): Payer: Medicare Other

## 2015-06-10 ENCOUNTER — Encounter (HOSPITAL_COMMUNITY): Payer: Self-pay

## 2015-06-10 DIAGNOSIS — Z7984 Long term (current) use of oral hypoglycemic drugs: Secondary | ICD-10-CM | POA: Diagnosis not present

## 2015-06-10 DIAGNOSIS — R5383 Other fatigue: Secondary | ICD-10-CM | POA: Insufficient documentation

## 2015-06-10 DIAGNOSIS — J029 Acute pharyngitis, unspecified: Secondary | ICD-10-CM | POA: Diagnosis not present

## 2015-06-10 DIAGNOSIS — E119 Type 2 diabetes mellitus without complications: Secondary | ICD-10-CM | POA: Insufficient documentation

## 2015-06-10 DIAGNOSIS — M25569 Pain in unspecified knee: Secondary | ICD-10-CM | POA: Insufficient documentation

## 2015-06-10 DIAGNOSIS — R079 Chest pain, unspecified: Secondary | ICD-10-CM | POA: Diagnosis present

## 2015-06-10 DIAGNOSIS — R630 Anorexia: Secondary | ICD-10-CM | POA: Diagnosis not present

## 2015-06-10 DIAGNOSIS — R531 Weakness: Secondary | ICD-10-CM | POA: Insufficient documentation

## 2015-06-10 DIAGNOSIS — R200 Anesthesia of skin: Secondary | ICD-10-CM | POA: Insufficient documentation

## 2015-06-10 DIAGNOSIS — R0789 Other chest pain: Secondary | ICD-10-CM

## 2015-06-10 DIAGNOSIS — R05 Cough: Secondary | ICD-10-CM | POA: Insufficient documentation

## 2015-06-10 DIAGNOSIS — R1013 Epigastric pain: Secondary | ICD-10-CM

## 2015-06-10 DIAGNOSIS — M791 Myalgia: Secondary | ICD-10-CM | POA: Insufficient documentation

## 2015-06-10 DIAGNOSIS — R6883 Chills (without fever): Secondary | ICD-10-CM | POA: Insufficient documentation

## 2015-06-10 HISTORY — DX: Type 2 diabetes mellitus without complications: E11.9

## 2015-06-10 LAB — BASIC METABOLIC PANEL
ANION GAP: 9 (ref 5–15)
BUN: 19 mg/dL (ref 6–20)
CALCIUM: 9.4 mg/dL (ref 8.9–10.3)
CO2: 25 mmol/L (ref 22–32)
CREATININE: 0.59 mg/dL — AB (ref 0.61–1.24)
Chloride: 103 mmol/L (ref 101–111)
Glucose, Bld: 159 mg/dL — ABNORMAL HIGH (ref 65–99)
Potassium: 4 mmol/L (ref 3.5–5.1)
SODIUM: 137 mmol/L (ref 135–145)

## 2015-06-10 LAB — I-STAT TROPONIN, ED
TROPONIN I, POC: 0 ng/mL (ref 0.00–0.08)
Troponin i, poc: 0 ng/mL (ref 0.00–0.08)

## 2015-06-10 LAB — CBG MONITORING, ED: GLUCOSE-CAPILLARY: 150 mg/dL — AB (ref 65–99)

## 2015-06-10 LAB — CBC
HCT: 37.9 % — ABNORMAL LOW (ref 39.0–52.0)
HEMOGLOBIN: 13.2 g/dL (ref 13.0–17.0)
MCH: 30.1 pg (ref 26.0–34.0)
MCHC: 34.8 g/dL (ref 30.0–36.0)
MCV: 86.3 fL (ref 78.0–100.0)
PLATELETS: 244 10*3/uL (ref 150–400)
RBC: 4.39 MIL/uL (ref 4.22–5.81)
RDW: 12.4 % (ref 11.5–15.5)
WBC: 7.3 10*3/uL (ref 4.0–10.5)

## 2015-06-10 MED ORDER — ASPIRIN 81 MG PO CHEW
324.0000 mg | CHEWABLE_TABLET | Freq: Once | ORAL | Status: AC
Start: 2015-06-10 — End: 2015-06-10
  Administered 2015-06-10: 324 mg via ORAL
  Filled 2015-06-10: qty 4

## 2015-06-10 NOTE — ED Notes (Signed)
Patient here with weakness and chest pain x 2 days. Reports that he is diabetic and thinks this is related to that disease. No distress on arrival

## 2015-06-10 NOTE — ED Provider Notes (Signed)
CSN: [id number]     Arrival date & time 06/10/15  1717 History   By signing my name below, I, Marisue Humble, attest that this documentation has been prepared under the direction and in the presence of Shon Baton, MD . Electronically Signed: Marisue Humble, Scribe. 06/10/2015. 11:43 PM.   Chief Complaint  Patient presents with  . Chest Pain  . fatigue/diabetes    The history is provided by the patient and a relative. No language interpreter was used.   HPI Comments:  Dwayne Davis is a 75 y.o. male with PMHx of DM and HLD who presents to the Emergency Department complaining of intermittent, central chest pain for the past week. Chest pain is worse with ambulation and eating certain foods like pork. Pt is not currently experiencing chest pain, but reports current numbness in all his fingers and toes. Family reports associated sore throat, cough, decreased appetite, chills, myalgias, fatigue, LLQ pain after eating, and knee pain for the past week. He was taking OTC medication for sore throat with mild alleviation. Pt ran out of his diabetes medication ~10 days ago; he is unsure of baseline blood sugars. Pt reports h/o smoking over 30 years ago. Denies sick contacts, shortness of breath, or prior cardiac work-ups.  Past Medical History  Diagnosis Date  . Diabetes mellitus without complication (HCC)    History reviewed. No pertinent past surgical history. No family history on file. Social History  Substance Use Topics  . Smoking status: Never Smoker   . Smokeless tobacco: None  . Alcohol Use: None    Review of Systems  Constitutional: Positive for chills, appetite change and fatigue.  HENT: Positive for sore throat.   Respiratory: Positive for cough. Negative for shortness of breath.   Cardiovascular: Positive for chest pain.  Gastrointestinal: Positive for abdominal pain.  Musculoskeletal: Positive for myalgias and arthralgias.  Neurological: Positive for numbness.  All  other systems reviewed and are negative.  Allergies  Tylenol  Home Medications   Prior to Admission medications   Medication Sig Start Date End Date Taking? Authorizing Provider  glipiZIDE (GLUCOTROL) 5 MG tablet Take 5 mg by mouth daily before breakfast.   Yes Historical Provider, MD  metFORMIN (GLUCOPHAGE) 500 MG tablet Take 1,000 mg by mouth 2 (two) times daily with a meal.   Yes Historical Provider, MD   BP 164/81 mmHg  Pulse 73  Temp(Src) 98.6 F (37 C) (Oral)  Resp 20  SpO2 97% Physical Exam  Constitutional: He is oriented to person, place, and time. He appears well-developed and well-nourished.  HENT:  Head: Normocephalic and atraumatic.  Cardiovascular: Normal rate, regular rhythm and normal heart sounds.   No murmur heard. Pulmonary/Chest: Effort normal and breath sounds normal. No respiratory distress. He has no wheezes. He exhibits no tenderness.  Abdominal: Soft. Bowel sounds are normal. There is tenderness. There is no rebound and no guarding.  Epigastric TTP without rebound or guarding  Musculoskeletal: He exhibits no edema.  Neurological: He is alert and oriented to person, place, and time.  Cranial nerves II through XII intact, no dysmetria to finger-nose-finger, sensation grossly intact, 5 out of 5 strength in all 4 extremities  Skin: Skin is warm and dry.  Psychiatric: He has a normal mood and affect.  Nursing note and vitals reviewed.   ED Course  Procedures  DIAGNOSTIC STUDIES:  Oxygen Saturation is 99% on RA, normal by my interpretation.    COORDINATION OF CARE:  11:13 PM Recommended follow-up with cardiologist  and PCP. Will administer aspirin. Will order chest x-ray, hepatic function panel, lipase and troponin. Discussed treatment plan with pt at bedside and pt agreed to plan.  1:26 AM Re-evaluated pt. He states he is feeling better.  Labs Review Labs Reviewed  BASIC METABOLIC PANEL - Abnormal; Notable for the following:    Glucose, Bld 159 (*)     Creatinine, Ser 0.59 (*)    All other components within normal limits  CBC - Abnormal; Notable for the following:    HCT 37.9 (*)    All other components within normal limits  HEPATIC FUNCTION PANEL - Abnormal; Notable for the following:    ALT 67 (*)    All other components within normal limits  CBG MONITORING, ED - Abnormal; Notable for the following:    Glucose-Capillary 150 (*)    All other components within normal limits  LIPASE, BLOOD  I-STAT TROPOININ, ED  I-STAT TROPOININ, ED    Imaging Review Dg Chest 2 View  06/10/2015  CLINICAL DATA:  Chest pain for 2 days EXAM: CHEST  2 VIEW COMPARISON:  01/08/2008 FINDINGS: The heart size and mediastinal contours are within normal limits. Both lungs are clear. The visualized skeletal structures are unremarkable. IMPRESSION: No active cardiopulmonary disease. Electronically Signed   By: Elige KoHetal  Patel   On: 06/10/2015 18:14   I have personally reviewed and evaluated these images and lab results as part of my medical decision-making.   EKG Interpretation ED ECG REPORT   Date: 06/11/2015  Rate: 68  Rhythm: normal sinus rhythm  QRS Axis: normal  Intervals: normal  ST/T Wave abnormalities: nonspecific T wave changes  Conduction Disutrbances:none  Narrative Interpretation: LVH  Old EKG Reviewed: unchanged  I have personally reviewed the EKG tracing and agree with the computerized printout as noted. T wave inversions inferiorly      MDM   Final diagnoses:  Other chest pain  Other fatigue  Epigastric pain    Patient presents with multiple complaints. Mostly weakness and chest pain. Some of the features of the chest pain are concerning for ACS; however, others appear to be associated with food intake. He also reports upper abdominal pain and generalized fatigue, weakness. He is nontoxic on exam. Vital signs reassuring. EKG shows nonspecific T-wave changes and LVH similar to prior. He's not having any active chest pain. Troponin 2  is negative. His heart score is 4 given risk factors and age and EKG. However, given that he is currently pain-free and has had multiple other symptoms, do not feel that ACS is his primary issue. Lab work including lipase and CMP obtained and reassuring.  He has been without complaint emergency department. He does not have his diabetes medication. Will refill this for the patient. Discussed with the patient and his family that he needs to follow-up closely with cardiology for stress testing. He also needs follow-up with his primary physician.  After history, exam, and medical workup I feel the patient has been appropriately medically screened and is safe for discharge home. Pertinent diagnoses were discussed with the patient. Patient was given return precautions.  I personally performed the services described in this documentation, which was scribed in my presence. The recorded information has been reviewed and is accurate.     Shon Batonourtney F Horton, MD 06/11/15 [phone]

## 2015-06-10 NOTE — ED Notes (Signed)
CBG: 150 

## 2015-06-11 LAB — LIPASE, BLOOD: Lipase: 13 U/L (ref 11–51)

## 2015-06-11 LAB — HEPATIC FUNCTION PANEL
ALK PHOS: 65 U/L (ref 38–126)
ALT: 67 U/L — AB (ref 17–63)
AST: 37 U/L (ref 15–41)
Albumin: 3.6 g/dL (ref 3.5–5.0)
BILIRUBIN DIRECT: 0.1 mg/dL (ref 0.1–0.5)
BILIRUBIN INDIRECT: 0.6 mg/dL (ref 0.3–0.9)
Total Bilirubin: 0.7 mg/dL (ref 0.3–1.2)
Total Protein: 6.8 g/dL (ref 6.5–8.1)

## 2015-06-11 MED ORDER — GLIPIZIDE 5 MG PO TABS: 5.0000 mg | ORAL_TABLET | Freq: Every day | ORAL | Status: AC

## 2015-06-11 MED ORDER — METFORMIN HCL 500 MG PO TABS: 1000.0000 mg | ORAL_TABLET | Freq: Two times a day (BID) | ORAL | Status: AC

## 2015-06-11 MED ORDER — OMEPRAZOLE 20 MG PO CPDR: 20.0000 mg | DELAYED_RELEASE_CAPSULE | Freq: Every day | ORAL | Status: AC

## 2015-06-11 NOTE — Discharge Instructions (Signed)
You were seen today for chest pain and multiple other symptoms. You need to be seen by cardiology for formal testing. All your testing in the ER was reassuring. He will also be placed on an acid reducer given that some of your pain was after eating. If he has new or worsening symptoms he needs to be reevaluated immediately.  Dolor de pecho inespecfico  (Nonspecific Chest Pain) El dolor de pecho puede deberse a muchas enfermedades diferentes. Siempre existe una posibilidad de que el dolor est relacionado con algo grave, como un infarto de miocardio o un cogulo sanguneo en los pulmones. Hay muchas enfermedades que no son potencialmente mortales que pueden causar dolor de [place]. Si tiene Engineer, mining de Hotel manager, es muy importante que se controle con el mdico. CAUSAS  Las causas del dolor de pecho pueden ser las siguientes:  Acidez estomacal.  Neumona o bronquitis.  Ansiedad o estrs.  Inflamacin de la zona que rodea al corazn (pericarditis) o a los pulmones (pleuritis o pleuresa).  Un cogulo sanguneo en el pulmn.  Colapso de un pulmn (neumotrax), que puede aparecer de [employer] repentina por s solo (neumotrax espontneo) o debido a un traumatismo en el trax.  Culebrilla (virus de la varicela zster).  Infarto de miocardio.  Dao de los [place], los msculos y los cartlagos que conforman la pared torcica. Esto puede incluir lo siguiente:  Hematomas seos debido a lesiones.  Distensiones musculares o de los cartlagos por tos frecuente o repetida, o por exceso de trabajo.  Fractura de una o ms costillas.  Dolor de TEFL teacher debido a inflamacin (costocondritis). FACTORES DE RIESGO  Los factores de riesgo de tener dolor de pecho pueden incluir lo siguiente:  Actividades que incrementan el riesgo de sufrir traumatismos o lesiones en el trax.  Infecciones o enfermedades respiratorias que causan tos frecuente.  Enfermedades o [employer] comidas que pueden causar  Engineering geologist.  Enfermedades cardacas o antecedentes familiares de enfermedades cardacas.  Enfermedades o comportamientos de salud que aumentan el riesgo de tener un cogulo sanguneo.  Haber tenido varicela (varicela zster). SIGNOS Y SNTOMAS El dolor de pecho puede provocar las siguientes sensaciones:  Ardor u hormigueo en la superficie o en lo profundo del pecho.  Dolor opresivo, continuo o constrictivo.  Dolor vago o intenso que empeora al [employer], toser o inhalar profundamente.  Dolor que tambin se siente en la espalda, el cuello, el hombro o el brazo, o dolor que se irradia a cualquiera de estas zonas. El dolor de pecho puede aparecer y Geneticist, molecular, o bien puede ser constante. DIAGNSTICO Gretchen Short se necesiten anlisis de laboratorio u otros estudios para Veterinary surgeon causa del Engineer, mining. El mdico puede indicarle que se haga una prueba llamada EGC (electrocadiograma) ambulatorio. El Regulatory affairs officer los patrones de los latidos cardacos en el momento en que se realiza el [place]. Tambin pueden hacerle otros estudios, por ejemplo:  Ecocardiograma transtorcico (ETT). Durante el ecocardiograma, se usan ondas sonoras para crear una imagen de todas las estructuras cardacas y evaluar cmo circula la sangre por el corazn.  Ecocardiograma transesofgico (ETE).Este es un estudio de diagnstico por imgenes ms avanzado que el obtiene imgenes del interior del cuerpo. Le permite al mdico ver el corazn con mayor detalle.  Monitoreo cardaco. Permite que el mdico controle la frecuencia y el ritmo cardaco en tiempo real.  Monitor Holter. Es un dispositivo porttil que [employer] latidos del corazn y puede ayudar a Education administrator las arritmias cardacas. Le permite al [employer] registrar la actividad cardaca durante varios  das, si es necesario.  Pruebas de esfuerzo. Estas pueden realizarse durante el ejercicio o mediante la administracin de un medicamento que acelera los latidos del  corazn.  Anlisis de [place].  Diagnstico por imgenes. TRATAMIENTO  El tratamiento depende de la causa del dolor de [place]. El tratamiento puede incluir lo siguiente:  Medicamentos. Estos pueden incluir lo siguiente:  Inhibidores de Publishing copyla acidez estomacal.  Antiinflamatorios.  Analgsicos para las enfermedades inflamatorias.  Antibiticos, si hay una infeccin.  Medicamentos para Northwest Airlinesdisolver los cogulos sanguneos.  Medicamentos para tratar la enfermedad arterial coronaria.  Tratamiento complementario para las enfermedades que no requieren la toma de medicamentos. Esto puede incluir lo siguiente:  Descansar.  Aplicar compresas fras o calientes en las zonas lesionadas.  Limitar las actividades hasta que Erie Insurance Groupdisminuya el dolor. INSTRUCCIONES PARA EL CUIDADO EN EL HOGAR  Si le recetaron antibiticos, asegrese de terminarlos, incluso si comienza a sentirse mejor.  Evite las SUPERVALU INCactividades que le causen dolor de [place].  No consuma ningn producto que contenga tabaco, lo que incluye cigarrillos, tabaco de Theatre managermascar o Administrator, Civil Servicecigarrillos electrnicos. Si necesita ayuda para dejar de fumar, consulte al mdico.  No beba alcohol.  Tome los medicamentos solamente como se lo haya indicado el mdico.  Concurra a todas las visitas de control como se lo haya indicado el mdico. Esto es importante. Esto incluye otros estudios si el dolor de pecho no desaparece.  Si la acidez es la causa del dolor de [place], tal vez le aconsejen que mantenga la cabeza levantada (elevada) mientras duerme. Esto reduce la probabilidad de que el cido retroceda del estmago al esfago.  Haga cambios en su estilo de vida como se lo haya indicado el mdico. Estos pueden incluir lo siguiente:  Education administratorracticar actividad fsica con regularidad. Pida al mdico que le sugiera algunas actividades que sean seguras para usted.  Consumir una dieta cardiosaludable. Un nutricionista matriculado puede ayudarlo a Software engineerhacer elecciones  saludables.  Mantener un peso saludable.  Controlar la diabetes, si es necesario.  Reducir las situaciones de estrs. SOLICITE ATENCIN MDICA SI:  El dolor de pecho no desaparece despus del tratamiento.  Tiene una erupcin cutnea con ampollas en el pecho.  Tiene fiebre. SOLICITE ATENCIN MDICA DE INMEDIATO SI:   El dolor en el pecho es ms intenso.  La tos empeora, o expectora sangre.  Siente un dolor abdominal intenso.  Siente debilidad intensa.  Se desmaya.  Tiene escalofros.  Tiene una molestia repentina e inexplicable en el pecho.  Tiene molestias repentinas e Exxon Mobil Corporationinexplicables en los brazos, la espalda, el cuello o la [place].  Le falta el aire en cualquier momento.  Comienza a sudar de [place] repentina o la piel se le humedece.  Siente nuseas o vomita.  Se siente repentinamente mareado o se desmaya.  Siente que el corazn comienza a latir rpidamente o que se saltea latidos. Estos sntomas pueden representar un problema grave que constituye Radio broadcast assistantuna emergencia. No espere hasta que los sntomas desaparezcan. Solicite atencin mdica de inmediato. Comunquese con el servicio de emergencias de su localidad (911 en los [place]). No conduzca por sus propios medios OfficeMax Incorporatedhasta el hospital.   Esta informacin no tiene Theme park managercomo fin reemplazar el consejo del mdico. Asegrese de hacerle al mdico cualquier pregunta que tenga.   Document Released: 01/25/2005 Document Revised: 02/15/2014 Elsevier Interactive Patient Education [employer]2016 Elsevier Inc.

## 2015-06-17 ENCOUNTER — Encounter: Payer: Self-pay | Admitting: Internal Medicine

## 2015-06-17 ENCOUNTER — Ambulatory Visit (INDEPENDENT_AMBULATORY_CARE_PROVIDER_SITE_OTHER): Payer: Medicare Other | Admitting: Internal Medicine

## 2015-06-17 VITALS — BP 124/74 | HR 64 | Ht 65.0 in | Wt 148.8 lb

## 2015-06-17 DIAGNOSIS — R0602 Shortness of breath: Secondary | ICD-10-CM

## 2015-06-17 DIAGNOSIS — E785 Hyperlipidemia, unspecified: Secondary | ICD-10-CM

## 2015-06-17 DIAGNOSIS — R9431 Abnormal electrocardiogram [ECG] [EKG]: Secondary | ICD-10-CM | POA: Diagnosis not present

## 2015-06-17 DIAGNOSIS — R7309 Other abnormal glucose: Secondary | ICD-10-CM

## 2015-06-17 LAB — LIPID PANEL
Cholesterol: 122 mg/dL — ABNORMAL LOW (ref 125–200)
HDL: 43 mg/dL (ref >=40)
LDL CALC: 45 mg/dL (ref <130)
TRIGLYCERIDES: 168 mg/dL — AB (ref <150)
Total CHOL/HDL Ratio: 2.8 Ratio (ref <=5.0)
VLDL: 34 mg/dL — AB (ref <30)

## 2015-06-17 LAB — HEMOGLOBIN A1C
HEMOGLOBIN A1C: 6.6 % — AB (ref <5.7)
MEAN PLASMA GLUCOSE: 143 mg/dL

## 2015-06-17 NOTE — Patient Instructions (Signed)
Your physician recommends that you continue on your current medications as directed. Please refer to the Current Medication list given to you today.  Your physician has requested that you have an echocardiogram. Echocardiography is a painless test that uses sound waves to create images of your heart. It provides your doctor with information about the size and shape of your heart and how well your heart's chambers and valves are working. This procedure takes approximately one hour. There are no restrictions for this procedure.  Your physician recommends that you return for lab work in: TODAY (LIPIDS, [facility])  Your physician has requested that you have a lexiscan myoview. For further information please visit [email or web address]. Please follow instruction sheet, as given.

## 2015-06-17 NOTE — Progress Notes (Signed)
Cardiology Office Note   Date:  06/17/2015   ID:  Dwayne Davis 1940/06/29, MRN [id number]  PCP:  No PCP Per Patient  Cardiologist:   Dietrich Pates, MD   No chief complaint on file.  Pt is referred for CP from ER     History of Present Illness: Dwayne Davis is a 75 y.o. male with a history of DM and HL  Seen in ER on 5/2 with CP   Pain intermitt  Worse with walking and with certain foods   SOme sore through chills fatgue LLQ pain   When he was seen in ER he was out of his diabetes meds  Granddaughter says that he is feeling some better now that he is back on them   Does get SOB with walking   Has not had any further chest discomfort     Activity Level:  No exercise.  Works a Proofreader  More tired at work   DM for few years          Outpatient Prescriptions Prior to Visit  Medication Sig Dispense Refill  . glipiZIDE (GLUCOTROL) 5 MG tablet Take 1 tablet (5 mg total) by mouth daily before breakfast. 30 tablet 0  . metFORMIN (GLUCOPHAGE) 500 MG tablet Take 2 tablets (1,000 mg total) by mouth 2 (two) times daily with a meal. 30 tablet 0  . omeprazole (PRILOSEC) 20 MG capsule Take 1 capsule (20 mg total) by mouth daily. 30 capsule 0   No facility-administered medications prior to visit.     Allergies:   Tylenol   Past Medical History  Diagnosis Date  . Diabetes mellitus without complication (HCC)     History reviewed. No pertinent past surgical history.   Social History:  The patient  reports that he has never smoked. He does not have any smokeless tobacco history on file. He reports that he does not drink alcohol or use illicit drugs.   Family History:  The patient has no known CAD  In family     ROS:  Please see the history of present illness. All other systems are reviewed and  Negative to the above problem except as noted.    PHYSICAL EXAM: VS:  BP 124/74 mmHg  Pulse 64  Ht  (1.651 m)  Wt 148 lb 12.8 oz (67.495 kg)  BMI  24.76 kg/m2  SpO2 98%  GEN: Well nourished, well developed, in no acute distress HEENT: normal Neck: no JVD, carotid bruits, or masses Cardiac: RRR; no murmurs, rubs, or gallops,no edema  Respiratory:  clear to auscultation bilaterally, normal work of breathing GI: soft, nontender, nondistended, + BS  No hepatomegaly  MS: no deformity Moving all extremities   Skin: warm and dry, no rash Neuro:  Strength and sensation are intact Psych: euthymic mood, full affect   EKG:  EKG is not ordered today.On 5/4 SR LVH with repolarization abnormalitiy     Lipid Panel    Component Value Date/Time   CHOL  06/11/2009 0713    191        ATP III CLASSIFICATION:  <200     mg/dL   Desirable  829-562  mg/dL   Borderline High  >=130    mg/dL   High          TRIG 865* 06/11/2009 0713   HDL 45 06/11/2009 0713   CHOLHDL 4.2 06/11/2009 0713   VLDL 33 06/11/2009 0713   LDLCALC * 06/11/2009 7846  113        Total Cholesterol/HDL:CHD Risk Coronary Heart Disease Risk Table                     Men   Women  1/2 Average Risk   3.4   3.3  Average Risk       5.0   4.4  2 X Average Risk   9.6   7.1  3 X Average Risk  23.4   11.0        Use the calculated Patient Ratio above and the CHD Risk Table to determine the patient's CHD Risk.        ATP III CLASSIFICATION (LDL):  <100     mg/dL   Optimal  [id number]  mg/dL   Near or Above                    Optimal  130-159  mg/dL   Borderline  [id number]  mg/dL   High  >811>190     mg/dL   Very High      Wt Readings from Last 3 Encounters:  06/17/15 148 lb 12.8 oz (67.495 kg)      ASSESSMENT AND PLAN:  1  Chest discomfort  I am not convinced that the discomfort represent angina  More concerning is the DOE   History is difficult at granddaughter interprets I would recomm a lexiscan myovue to r/o inducible ischemia  2.  DM  Check Hgb A1C  He has recently restarted meds but this will give idea of how bad his glu control is  3.  HCM  Set up for fasting  lipids     F/U will be based on test results     Signed, Dietrich PatesPaula Alwyn Cordner, MD  06/17/2015 9:19 AM    [facility] [address], [place], [place]  [address] Phone: [phone]; Fax: [phone]

## 2015-06-24 ENCOUNTER — Telehealth: Payer: Self-pay | Admitting: Internal Medicine

## 2015-06-24 NOTE — Telephone Encounter (Signed)
New message       *STAT* If patient is at the pharmacy, call can be transferred to refill team.   1. Which medications need to be refilled? (please list name of each medication and dose if known) metformin 500mg  2. Which pharmacy/location (including street and city if local pharmacy) [facility] [address]  3. Do they need a 30 day or 90 day supply? 30

## 2015-06-26 ENCOUNTER — Telehealth: Payer: Self-pay | Admitting: *Deleted

## 2015-06-26 NOTE — Telephone Encounter (Signed)
Called and left detailed message of lab results.

## 2015-06-26 NOTE — Telephone Encounter (Signed)
Left detailed message (ok per DPR) on [phone] (grand daughter #) that Dr. Tenny Crawoss does not refill diabetes medications and that would need to be sent to his PCP for refills.

## 2015-06-30 ENCOUNTER — Telehealth (HOSPITAL_COMMUNITY): Payer: Self-pay | Admitting: *Deleted

## 2015-06-30 NOTE — Telephone Encounter (Signed)
Patient's grandaughtert given detailed instructions per Myocardial Perfusion Study Information Sheet for the test on 07/02/15. Patient notified to arrive 15 minutes early and that it is imperative to arrive on time for appointment to keep from having the test rescheduled.  If you need to cancel or reschedule your appointment, please call the office within 24 hours of your appointment. Failure to do so may result in a cancellation of your appointment, and a $50 no show fee. Patient verbalized understanding.Dwayne CharMary J Sabirin Baray, RN

## 2015-07-02 ENCOUNTER — Ambulatory Visit (HOSPITAL_COMMUNITY): Payer: Medicare Other | Attending: Interventional Cardiology

## 2015-07-02 ENCOUNTER — Other Ambulatory Visit: Payer: Self-pay

## 2015-07-02 ENCOUNTER — Ambulatory Visit (HOSPITAL_BASED_OUTPATIENT_CLINIC_OR_DEPARTMENT_OTHER): Payer: Medicare Other

## 2015-07-02 DIAGNOSIS — R079 Chest pain, unspecified: Secondary | ICD-10-CM | POA: Diagnosis not present

## 2015-07-02 DIAGNOSIS — R0602 Shortness of breath: Secondary | ICD-10-CM | POA: Insufficient documentation

## 2015-07-02 DIAGNOSIS — R7309 Other abnormal glucose: Secondary | ICD-10-CM | POA: Diagnosis not present

## 2015-07-02 DIAGNOSIS — I071 Rheumatic tricuspid insufficiency: Secondary | ICD-10-CM | POA: Insufficient documentation

## 2015-07-02 DIAGNOSIS — E119 Type 2 diabetes mellitus without complications: Secondary | ICD-10-CM | POA: Insufficient documentation

## 2015-07-02 DIAGNOSIS — R9431 Abnormal electrocardiogram [ECG] [EKG]: Secondary | ICD-10-CM

## 2015-07-02 LAB — MYOCARDIAL PERFUSION IMAGING
CHL CUP NUCLEAR SDS: 0
CHL CUP NUCLEAR SRS: 1
CHL CUP NUCLEAR SSS: 1
CHL CUP RESTING HR STRESS: 65 {beats}/min
CSEPPHR: 90 {beats}/min
LHR: 0.16
LV dias vol: 103 mL (ref 62–150)
LVSYSVOL: 51 mL
TID: 1.11

## 2015-07-02 LAB — ECHOCARDIOGRAM COMPLETE
HEIGHTINCHES: 65 in
Weight: 2368 oz

## 2015-07-02 MED ORDER — REGADENOSON 0.4 MG/5ML IV SOLN
0.4000 mg | Freq: Once | INTRAVENOUS | Status: AC
Start: 2015-07-02 — End: 2015-07-02
  Administered 2015-07-02: 0.4 mg via INTRAVENOUS

## 2015-07-02 MED ORDER — TECHNETIUM TC 99M TETROFOSMIN IV KIT
10.7000 | PACK | Freq: Once | INTRAVENOUS | Status: AC | PRN
  Administered 2015-07-02: 11 via INTRAVENOUS
  Filled 2015-07-02: qty 11

## 2015-07-02 MED ORDER — TECHNETIUM TC 99M TETROFOSMIN IV KIT
32.7000 | PACK | Freq: Once | INTRAVENOUS | Status: AC | PRN
  Administered 2015-07-02: 32.7 via INTRAVENOUS
  Filled 2015-07-02: qty 33

## 2017-02-08 DEATH — deceased

## 2019-07-12 ENCOUNTER — Other Ambulatory Visit: Payer: Self-pay

## 2019-07-12 ENCOUNTER — Emergency Department (HOSPITAL_BASED_OUTPATIENT_CLINIC_OR_DEPARTMENT_OTHER): Payer: Medicare Other

## 2019-07-12 ENCOUNTER — Encounter (HOSPITAL_BASED_OUTPATIENT_CLINIC_OR_DEPARTMENT_OTHER): Payer: Self-pay | Admitting: *Deleted

## 2019-07-12 ENCOUNTER — Emergency Department (HOSPITAL_BASED_OUTPATIENT_CLINIC_OR_DEPARTMENT_OTHER)
Admission: EM | Admit: 2019-07-12 | Discharge: 2019-07-13 | Disposition: A | Discharge status: Home / Self Care | Payer: Medicare Other | Attending: Emergency Medicine | Admitting: Emergency Medicine

## 2019-07-12 DIAGNOSIS — R609 Edema, unspecified: Secondary | ICD-10-CM

## 2019-07-12 DIAGNOSIS — M79671 Pain in right foot: Secondary | ICD-10-CM | POA: Insufficient documentation

## 2019-07-12 DIAGNOSIS — E119 Type 2 diabetes mellitus without complications: Secondary | ICD-10-CM | POA: Insufficient documentation

## 2019-07-12 DIAGNOSIS — R600 Localized edema: Secondary | ICD-10-CM | POA: Diagnosis not present

## 2019-07-12 DIAGNOSIS — Z79899 Other long term (current) drug therapy: Secondary | ICD-10-CM | POA: Insufficient documentation

## 2019-07-12 DIAGNOSIS — Z7984 Long term (current) use of oral hypoglycemic drugs: Secondary | ICD-10-CM | POA: Diagnosis not present

## 2019-07-12 LAB — CBC WITH DIFFERENTIAL/PLATELET
Abs Immature Granulocytes: 0.01 10*3/uL (ref 0.00–0.07)
Basophils Absolute: 0.1 10*3/uL (ref 0.0–0.1)
Basophils Relative: 1 %
Eosinophils Absolute: 0.2 10*3/uL (ref 0.0–0.5)
Eosinophils Relative: 3 %
HCT: 38.4 % — ABNORMAL LOW (ref 39.0–52.0)
Hemoglobin: 12.9 g/dL — ABNORMAL LOW (ref 13.0–17.0)
Immature Granulocytes: 0 %
Lymphocytes Relative: 34 %
Lymphs Abs: 2.3 10*3/uL (ref 0.7–4.0)
MCH: 29.9 pg (ref 26.0–34.0)
MCHC: 33.6 g/dL (ref 30.0–36.0)
MCV: 89.1 fL (ref 80.0–100.0)
Monocytes Absolute: 0.6 10*3/uL (ref 0.1–1.0)
Monocytes Relative: 9 %
Neutro Abs: 3.5 10*3/uL (ref 1.7–7.7)
Neutrophils Relative %: 53 %
Platelets: 238 10*3/uL (ref 150–400)
RBC: 4.31 MIL/uL (ref 4.22–5.81)
RDW: 12.7 % (ref 11.5–15.5)
WBC: 6.6 10*3/uL (ref 4.0–10.5)
nRBC: 0 % (ref 0.0–0.2)

## 2019-07-12 LAB — COMPREHENSIVE METABOLIC PANEL
ALT: 28 U/L (ref 0–44)
AST: 21 U/L (ref 15–41)
Albumin: 4 g/dL (ref 3.5–5.0)
Alkaline Phosphatase: 88 U/L (ref 38–126)
Anion gap: 9 (ref 5–15)
BUN: 19 mg/dL (ref 8–23)
CO2: 25 mmol/L (ref 22–32)
Calcium: 9 mg/dL (ref 8.9–10.3)
Chloride: 102 mmol/L (ref 98–111)
Creatinine, Ser: 0.56 mg/dL — ABNORMAL LOW (ref 0.61–1.24)
GFR calc Af Amer: >60 mL/min (ref >60)
GFR calc non Af Amer: >60 mL/min (ref >60)
Glucose, Bld: 196 mg/dL — ABNORMAL HIGH (ref 70–99)
Potassium: 4.1 mmol/L (ref 3.5–5.1)
Sodium: 136 mmol/L (ref 135–145)
Total Bilirubin: 0.8 mg/dL (ref 0.3–1.2)
Total Protein: 7.3 g/dL (ref 6.5–8.1)

## 2019-07-12 LAB — BRAIN NATRIURETIC PEPTIDE: B Natriuretic Peptide: 43.7 pg/mL (ref 0.0–100.0)

## 2019-07-12 MED ORDER — DOXYCYCLINE HYCLATE 100 MG PO CAPS: 100.0000 mg | ORAL_CAPSULE | Freq: Two times a day (BID) | ORAL | 0 refills | Status: AC

## 2019-07-12 MED ORDER — POTASSIUM CHLORIDE CRYS ER 20 MEQ PO TBCR: 20.0000 meq | EXTENDED_RELEASE_TABLET | Freq: Every day | ORAL | 0 refills | Status: AC

## 2019-07-12 MED ORDER — FUROSEMIDE 20 MG PO TABS
20.0000 mg | ORAL_TABLET | Freq: Every day | ORAL | 0 refills | Status: AC
Start: 2019-07-12 — End: ?

## 2019-07-12 MED ORDER — FUROSEMIDE 10 MG/ML IJ SOLN
20.0000 mg | Freq: Once | INTRAMUSCULAR | Status: AC
  Administered 2019-07-12: 20 mg via INTRAVENOUS
  Filled 2019-07-12: qty 2

## 2019-07-12 NOTE — ED Provider Notes (Signed)
Procedure [facility] EMERGENCY DEPARTMENT Provider Note   CSN: [id number] Arrival date & time: 07/12/19  1813     History Chief Complaint  Patient presents with  . Leg Swelling    Dwayne Davis is a 79 y.o. male.  Patient is a 79 year old male with a history of diabetes who presents with leg swelling.  He has noticed that for about the last 10 days he has swelling of his right leg.  He has some pain in his right foot.  He is also noticed some swelling in his left leg.  He has not noticed any sores.  He says his right leg has been discolored.  No known fevers.  No shortness of breath.  No cough or cold symptoms.  He was seen at his endocrinologist office on May 25 for swelling and at that time he had told the provider that his swelling been going on for the last 1 to 2 months.  He had noticed some redness in his foot and was started on Bactrim for 5-day course but he says he only took 3 of the days because there was no improvement in symptoms and he started having some worsening symptoms.  No history of similar swelling in the past.  He says his swelling is almost gone in the morning but gets progressively worse throughout the day.        Past Medical History:  Diagnosis Date  . Diabetes mellitus without complication (HCC)     There are no problems to display for this patient.   No past surgical history on file.     No family history on file.  Social History   Tobacco Use  . Smoking status: Never Smoker  Substance Use Topics  . Alcohol use: No  . Drug use: No    Home Medications Prior to Admission medications   Medication Sig Start Date End Date Taking? Authorizing Provider  doxycycline (VIBRAMYCIN) 100 MG capsule Take 1 capsule (100 mg total) by mouth 2 (two) times daily. One po bid x 7 days 07/12/19   Rolan Bucco, MD  furosemide (LASIX) 20 MG tablet Take 1 tablet (20 mg total) by mouth daily. 07/12/19   Rolan Bucco, MD  glipiZIDE (GLUCOTROL) 5 MG  tablet Take 1 tablet (5 mg total) by mouth daily before breakfast. 06/11/15   Horton, Mayer Masker, MD  metFORMIN (GLUCOPHAGE) 500 MG tablet Take 2 tablets (1,000 mg total) by mouth 2 (two) times daily with a meal. 06/11/15   Horton, Mayer Masker, MD  omeprazole (PRILOSEC) 20 MG capsule Take 1 capsule (20 mg total) by mouth daily. 06/11/15   Horton, Mayer Masker, MD  potassium chloride SA (KLOR-CON) 20 MEQ tablet Take 1 tablet (20 mEq total) by mouth daily. 07/12/19   Rolan Bucco, MD    Allergies    Tylenol [acetaminophen]  Review of Systems   Review of Systems  Constitutional: Negative for chills, diaphoresis, fatigue and fever.  HENT: Negative for congestion, rhinorrhea and sneezing.   Eyes: Negative.   Respiratory: Negative for cough, chest tightness and shortness of breath.   Cardiovascular: Positive for leg swelling. Negative for chest pain.  Gastrointestinal: Negative for abdominal pain, blood in stool, diarrhea, nausea and vomiting.  Genitourinary: Negative for difficulty urinating, flank pain, frequency and hematuria.  Musculoskeletal: Negative for arthralgias and back pain.  Skin: Negative for rash.  Neurological: Negative for dizziness, speech difficulty, weakness, numbness and headaches.    Physical Exam Updated Vital Signs BP (!) 143/72  Pulse (!) 58   Temp 98.5 F (36.9 C) (Oral)   Resp (!) 21   SpO2 99%   Physical Exam Constitutional:      Appearance: He is well-developed.  HENT:     Head: Normocephalic and atraumatic.  Eyes:     Pupils: Pupils are equal, round, and reactive to light.  Cardiovascular:     Rate and Rhythm: Normal rate and regular rhythm.     Heart sounds: Normal heart sounds.  Pulmonary:     Effort: Pulmonary effort is normal. No respiratory distress.     Breath sounds: Normal breath sounds. No wheezing or rales.  Chest:     Chest wall: No tenderness.  Abdominal:     General: Bowel sounds are normal.     Palpations: Abdomen is soft.      Tenderness: There is no abdominal tenderness. There is no guarding or rebound.  Musculoskeletal:        General: Normal range of motion.     Cervical back: Normal range of motion and neck supple.     Comments: 2+ pitting edema to the right lower extremity, 1+ pitting edema to the left lower extremity, pedal pulses are intact.  There is some very mild erythema and warmth overlying the lower leg/ankle area.  No obvious infection/abscess is noted.  No sores..  Lymphadenopathy:     Cervical: No cervical adenopathy.  Skin:    General: Skin is warm and dry.     Findings: No rash.  Neurological:     Mental Status: He is alert and oriented to person, place, and time.     ED Results / Procedures / Treatments   Labs (all labs ordered are listed, but only abnormal results are displayed) Labs Reviewed  CBC WITH DIFFERENTIAL/PLATELET - Abnormal; Notable for the following components:      Result Value   Hemoglobin 12.9 (*)    HCT 38.4 (*)    All other components within normal limits  COMPREHENSIVE METABOLIC PANEL - Abnormal; Notable for the following components:   Glucose, Bld 196 (*)    Creatinine, Ser 0.56 (*)    All other components within normal limits  BRAIN NATRIURETIC PEPTIDE    EKG EKG Interpretation  Date/Time:  Thursday July 12 2019 21:33:12 EDT Ventricular Rate:  63 PR Interval:    QRS Duration: 94 QT Interval:  406 QTC Calculation: 416 R Axis:   20 Text Interpretation: Sinus rhythm Borderline T wave abnormalities since last tracing no significant change Confirmed by Rolan Bucco [phone]) on 07/12/2019 11:50:18 PM   Radiology DG Chest 2 View  Result Date: 07/12/2019 CLINICAL DATA:  79 year old male with right lower leg pain. EXAM: CHEST - 2 VIEW COMPARISON:  Chest radiograph dated 06/10/2015 FINDINGS: Mild diffuse interstitial and peribronchial coarsening. No focal consolidation, pleural effusion, pneumothorax. The cardiac silhouette is within normal limits. No acute osseous  pathology. IMPRESSION: No active cardiopulmonary disease. Electronically Signed   By: Elgie Collard M.D.   On: 07/12/2019 21:32   US Venous Img Lower Unilateral Right  Result Date: 07/12/2019 CLINICAL DATA:  Right lower extremity swelling EXAM: RIGHT LOWER EXTREMITY VENOUS DOPPLER ULTRASOUND TECHNIQUE: Gray-scale sonography with graded compression, as well as color Doppler and duplex ultrasound were performed to evaluate the lower extremity deep venous systems from the level of the common femoral vein and including the common femoral, femoral, profunda femoral, popliteal and calf veins including the posterior tibial, peroneal and gastrocnemius veins when visible. The superficial great saphenous vein  was also interrogated. Spectral Doppler was utilized to evaluate flow at rest and with distal augmentation maneuvers in the common femoral, femoral and popliteal veins. COMPARISON:  None. FINDINGS: Contralateral Common Femoral Vein: Respiratory phasicity is normal and symmetric with the symptomatic side. No evidence of thrombus. Normal compressibility. Common Femoral Vein: No evidence of thrombus. Normal compressibility, respiratory phasicity and response to augmentation. Saphenofemoral Junction: No evidence of thrombus. Normal compressibility and flow on color Doppler imaging. Profunda Femoral Vein: No evidence of thrombus. Normal compressibility and flow on color Doppler imaging. Femoral Vein: No evidence of thrombus. Normal compressibility, respiratory phasicity and response to augmentation. Popliteal Vein: No evidence of thrombus. Normal compressibility, respiratory phasicity and response to augmentation. Calf Veins: No evidence of thrombus. Normal compressibility and flow on color Doppler imaging. Superficial Great Saphenous Vein: No evidence of thrombus. Normal compressibility. Venous Reflux:  None. Other Findings:  Subcutaneous edema is evident. IMPRESSION: No evidence of right lower extremity deep venous  thrombosis. Electronically Signed   By: Duanne Guess D.O.   On: 07/12/2019 19:23   DG Foot Complete Right  Result Date: 07/12/2019 CLINICAL DATA:  Right foot pain and swelling for 1 week EXAM: RIGHT FOOT COMPLETE - 3+ VIEW COMPARISON:  None. FINDINGS: No acute fracture or dislocation is noted. Mild soft tissue swelling is noted. Mild degenerative changes of the first MTP joint are noted. Calcaneal spurring is seen. IMPRESSION: Mild soft tissue swelling without acute bony abnormality. Electronically Signed   By: Alcide Clever M.D.   On: 07/12/2019 21:31    Procedures Procedures (including critical care time)  Medications Ordered in ED Medications  furosemide (LASIX) injection 20 mg (20 mg Intravenous Given 07/12/19 2229)    ED Course  I have reviewed the triage vital signs and the nursing notes.  Pertinent labs & imaging results that were available during my care of the patient were reviewed by me and considered in my medical decision making (see chart for details).    MDM Rules/Calculators/A&P                      Patient is a 80 year old male who presents with leg swelling.  He definitely has more swelling on the right but also has some on the left.  Patient is a CHF.  No pulmonary edema is noted.  His BNP is normal.  His creatinine is normal.  No evidence of anemia.  His other labs are unremarkable.  He had an ultrasound of his right leg which shows no evidence of DVT.  X-rays of the foot show no evidence of fracture or osteomyelitis.  I suspect that this is peripheral edema although given the increased unilateral swelling with the warmth, I will go ahead and treat him with antibiotics.  He only took 3 days worth of the antibiotics are previously prescribed.  I will start him on doxycycline as well as a short course of Lasix.  I encouraged him to have close follow-up with his PCP for recheck.  Return precautions were given.  All the history and instructions were obtained through the video  language line. Final Clinical Impression(s) / ED Diagnoses Final diagnoses:  Peripheral edema    Rx / DC Orders ED Discharge Orders         Ordered    doxycycline (VIBRAMYCIN) 100 MG capsule  2 times daily     07/12/19 2351    furosemide (LASIX) 20 MG tablet  Daily     07/12/19 2351  potassium chloride SA (KLOR-CON) 20 MEQ tablet  Daily     07/12/19 2351           Malvin Johns, MD 07/12/19 2354

## 2019-07-12 NOTE — ED Triage Notes (Signed)
Pt c/o right lower leg pain and discoloration x 1 week
